# Patient Record
Sex: Female | Born: 1955 | Hispanic: No | Marital: Married | Smoking: Never smoker
Health system: Southern US, Community
[De-identification: ages and names within clinical notes are randomized; demographics above are authoritative.]

## PROBLEM LIST (undated history)

## (undated) ENCOUNTER — Emergency Department: Admission: EM | Payer: Self-pay | Source: Home / Self Care

## (undated) DIAGNOSIS — M5090 Cervical disc disorder, unspecified, unspecified cervical region: Secondary | ICD-10-CM

## (undated) DIAGNOSIS — Z832 Family history of diseases of the blood and blood-forming organs and certain disorders involving the immune mechanism: Secondary | ICD-10-CM

## (undated) DIAGNOSIS — R51 Headache: Secondary | ICD-10-CM

## (undated) DIAGNOSIS — G8929 Other chronic pain: Secondary | ICD-10-CM

## (undated) DIAGNOSIS — R519 Headache, unspecified: Secondary | ICD-10-CM

## (undated) DIAGNOSIS — I10 Essential (primary) hypertension: Secondary | ICD-10-CM

## (undated) DIAGNOSIS — K279 Peptic ulcer, site unspecified, unspecified as acute or chronic, without hemorrhage or perforation: Secondary | ICD-10-CM

## (undated) DIAGNOSIS — M75100 Unspecified rotator cuff tear or rupture of unspecified shoulder, not specified as traumatic: Secondary | ICD-10-CM

## (undated) HISTORY — DX: Cervical disc disorder, unspecified, unspecified cervical region: M50.90

## (undated) HISTORY — DX: Family history of diseases of the blood and blood-forming organs and certain disorders involving the immune mechanism: Z83.2

## (undated) HISTORY — DX: Headache, unspecified: R51.9

## (undated) HISTORY — PX: TONSILLECTOMY: SUR1361

## (undated) HISTORY — DX: Headache: R51

## (undated) HISTORY — PX: NECK SURGERY: SHX720

## (undated) HISTORY — DX: Peptic ulcer, site unspecified, unspecified as acute or chronic, without hemorrhage or perforation: K27.9

## (undated) HISTORY — DX: Unspecified rotator cuff tear or rupture of unspecified shoulder, not specified as traumatic: M75.100

## (undated) HISTORY — PX: OTHER SURGICAL HISTORY: SHX169

---
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
## 2008-11-06 ENCOUNTER — Ambulatory Visit: Payer: Self-pay | Admitting: Pulmonary Disease

## 2008-11-06 DIAGNOSIS — Z8711 Personal history of peptic ulcer disease: Secondary | ICD-10-CM

## 2008-11-06 DIAGNOSIS — J309 Allergic rhinitis, unspecified: Secondary | ICD-10-CM | POA: Insufficient documentation

## 2008-11-06 DIAGNOSIS — R51 Headache: Secondary | ICD-10-CM

## 2008-11-06 DIAGNOSIS — M719 Bursopathy, unspecified: Secondary | ICD-10-CM

## 2008-11-06 DIAGNOSIS — J45991 Cough variant asthma: Secondary | ICD-10-CM

## 2008-11-06 DIAGNOSIS — M5090 Cervical disc disorder, unspecified, unspecified cervical region: Secondary | ICD-10-CM | POA: Insufficient documentation

## 2008-11-06 DIAGNOSIS — R519 Headache, unspecified: Secondary | ICD-10-CM | POA: Insufficient documentation

## 2008-11-06 DIAGNOSIS — M67919 Unspecified disorder of synovium and tendon, unspecified shoulder: Secondary | ICD-10-CM | POA: Insufficient documentation

## 2008-11-09 LAB — CONVERTED CEMR LAB
AST: 23 units/L (ref 0–37)
Albumin: 3.9 g/dL (ref 3.5–5.2)
Alkaline Phosphatase: 65 units/L (ref 39–117)
BUN: 18 mg/dL (ref 6–23)
Chloride: 110 meq/L (ref 96–112)
Eosinophils Relative: 0.7 % (ref 0.0–5.0)
GFR calc non Af Amer: 94 mL/min
Glucose, Bld: 93 mg/dL (ref 70–99)
Lymphocytes Relative: 40.9 % (ref 12.0–46.0)
Monocytes Relative: 8.1 % (ref 3.0–12.0)
Neutrophils Relative %: 49.3 % (ref 43.0–77.0)
Platelets: 373 10*3/uL (ref 150–400)
Potassium: 3.9 meq/L (ref 3.5–5.1)
Total Protein: 7.1 g/dL (ref 6.0–8.3)
WBC: 7.1 10*3/uL (ref 4.5–10.5)

## 2008-11-24 ENCOUNTER — Telehealth (INDEPENDENT_AMBULATORY_CARE_PROVIDER_SITE_OTHER): Payer: Self-pay | Admitting: *Deleted

## 2008-12-01 ENCOUNTER — Ambulatory Visit: Payer: Self-pay | Admitting: Pulmonary Disease

## 2008-12-19 ENCOUNTER — Encounter: Payer: Self-pay | Admitting: Pulmonary Disease

## 2008-12-23 ENCOUNTER — Telehealth (INDEPENDENT_AMBULATORY_CARE_PROVIDER_SITE_OTHER): Payer: Self-pay | Admitting: *Deleted

## 2009-01-09 ENCOUNTER — Encounter: Payer: Self-pay | Admitting: Pulmonary Disease

## 2009-02-09 ENCOUNTER — Ambulatory Visit: Payer: Self-pay | Admitting: Pulmonary Disease

## 2009-02-09 DIAGNOSIS — E663 Overweight: Secondary | ICD-10-CM | POA: Insufficient documentation

## 2009-02-09 DIAGNOSIS — R5383 Other fatigue: Secondary | ICD-10-CM

## 2009-02-09 DIAGNOSIS — R5381 Other malaise: Secondary | ICD-10-CM

## 2009-03-20 ENCOUNTER — Telehealth: Payer: Self-pay | Admitting: Pulmonary Disease

## 2009-04-06 ENCOUNTER — Encounter: Payer: Self-pay | Admitting: Pulmonary Disease

## 2009-11-09 ENCOUNTER — Telehealth: Payer: Self-pay | Admitting: Pulmonary Disease

## 2009-11-24 ENCOUNTER — Ambulatory Visit: Payer: Self-pay | Admitting: Pulmonary Disease

## 2009-11-25 ENCOUNTER — Telehealth (INDEPENDENT_AMBULATORY_CARE_PROVIDER_SITE_OTHER): Payer: Self-pay | Admitting: *Deleted

## 2010-11-02 NOTE — Assessment & Plan Note (Signed)
Summary: rov/yearly/apc   CC:  9 month ROV & review of medical problems....  History of Present Illness: 55 y/o WF, mother of Angel Griffith, here for a follow up visit...   ~  Feb09:  she notes a dry hacking irritative cough x2months- assoc w/ some wheezing, chest congestion, SOB, small amt beige sputum on occas, no blood... she denies CP, palpit, dizzy, edema, etc... she went to [facility] and treated for bronchitis but no better...       ** Hx reveals asthma & AB in the past- main trigger appears to be URI's, Rx w/ Albuterol inhaler Prn...       ** she notes that her mother & grandmother had allergies that became apparent in thier 42's, and she gets sinus problems in the spring & fall of the year...       ** pt has also had a hx of PUD w/ a DU found on EGD in 2003, but she denies much reflux or nocturnal symptoms...      ** eval revealed clear CXR,  no signif obstruction on PFT,  labs all WNL x sed= 36 (?etiology)...      ** treated w/ Zyrtek, Flonase, Saline for sinuses.Marland KitchenMarland KitchenPred taper, Symbicort160, Mucinex, Proair Prn for asthma... Prilosec Bid for reflux...   ~  Mar10:  improved but persists w/ her c/o cough & congestion- she thinks it's sinus related and we discussed next step eval w/ allegy/ immunology check by DrKozlow- seen 3/10 w/ LER & reflux induced cough diagnosed, her allergy tests were neg but she doesn't believe it since she gets eye & nasal allergy symptoms when exposed outdoors... improved further on PPI + Ranitadine, along w/ continued Symbicort, Flonase, Antihistamines, etc...   ~  May10:  despite improvement from the pulm standpoint, Angel Griffith has developed a litany of other complaints including:  fatigue, not resting well, wakes tired, + some aches and pains that are suggestive of fibromyalgia... but she is worried about her thyroid- since her mother was hypo & her daughter has Grave's- & she notes hot flashes, irreg menses, can't lose weight ("it must be my thyroid because I eat  right & I'm very active- I'm 5'1" tall and I shouldn't weigh 160#")... she would like a thorough endocrine eval from DrDoerr in [facility]... she is clinically euthyroid, and screening TSH here 2/10 was normal at 1.01...   ~  November 24, 2009:  she saw DrDoerr (Endocine) in [facility] w/ labs & RAIU but we don't have these results "it was all WNL, but at the bottom end of norm" she says... she has stopped all her inhalers & antireflux Rx & was doing well until cough, some congestion, drainage returned over the last few months & antihist OTC not helping... just using Proair now, she still thinks "aalegy". refuses Pred Rx... I reviewed DrKozlow's notes- LER Rx w/ PPI + Ranitadine & airway dis Rx w/ Symbicort (insurance won't cover), Pred taper, Mucinex, Proair...    Current Problems:  ALLERGIC RHINITIS (ICD-477.9) - she notes spring and fall predominence for allergy/ sinus problems...  on OTC antihist & FLONASE Qhs w/ Saline Prn... she is troubled by "runny nose" and eye symptoms...  ~  allergy testing by DrKozlow 3/10 were neg...  ~  2/11: she still thinks "allergy" & we discussed 2nd opinion w/ DrSharma etal.  ASTHMA (ICD-493.90) - hx asthma & asthmatic bronchitis... ** see above ** - prev improved/ resolved w/ Rx for LER using Antireflux regimen, PPI & Ranitadine>> but  she stopped all Rx when better...  ~  2/11: symptoms returned>> Rx based on her insurance>> PROAIR Bid&Prn, QVAR 80Bid, MUCINEX Bid, Fluids, etc... she refuses Pred at this time... also Rx PROTONIX 40Qam + RANITADNE 300mg Qhs & antireflux regimen.  ? of THYROID PROBLEM - ** see above ** testing here & by [facility] was normal in 2010...  PEPTIC ULCER DISEASE, HX OF (ICD-V12.71) - she reports hx of PUD w/ duodenal ulcer seen on EGD in [place] in  ~2003... she denies lower GI problems- she has never had a colonoscopy & is overdue for this screening procedure...  ~  2/11:  rec to restart PROTONIX 40mg Qam & RANITADINE 300mg  Qhs, plus  antireflux regimen.  CERVICAL DISC DISORDER (ICD-722.91) - she notes hx of neck pain w/ Dx of herniated discs at C4-5 & C5-6... she uses Flexeril as needed w/ relief...  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 she is INTOL to Relafen & Naprosyn...  HEADACHE, CHRONIC (ICD-784.0) - she relates freq HA's to her sinuses and neck problems...  Family Hx of FACTOR V DEFICIENCY (ICD-286.3) - states brother diagnosed w/ Factor V Leiden deficiency when he developed a blood clot at age 20...   Allergies: 1)  ! Nabumetone (Nabumetone) 2)  ! Naproxen (Naproxen) 3)  ! Aspirin (Aspirin)  Comments:  Nurse/Medical Assistant: The patient's medications and allergies were reviewed with the patient and were updated in the Medication and Allergy Lists.  Past History:  Past Medical History:  ALLERGIC RHINITIS (ICD-477.9) ASTHMA (ICD-493.90) PEPTIC ULCER DISEASE, HX OF (ICD-V12.71) CERVICAL DISC DISORDER (ICD-722.91) Hx of ROTATOR CUFF SYNDROME (ICD-726.10) HEADACHE, CHRONIC (ICD-784.0) Family Hx of FACTOR V DEFICIENCY (ICD-286.3)  Past Surgical History: S/P tonsillectomy S/P CSection S/P BTL  Family History: Reviewed history from 11/06/2008 and no changes required. Father died age 87 w/ strokes, DM, amputation, +smoker, Etoh... Mother died age 58 w/ sudden death after bladder surgery, & hx of autoimmune "everything" RA/ Lupus/ Polymyositis, and Lymphoma... 1 Sibling- Brother w/ Factor V Leiden defic and blood clot at age 29...  Social History: Reviewed history from 11/06/2008 and no changes required. 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  The patient denies anorexia, fever, weight loss, weight gain, vision loss, decreased  hearing, hoarseness, chest pain, syncope, peripheral edema, prolonged cough, headaches, hemoptysis, abdominal pain, melena, hematochezia, severe indigestion/heartburn, hematuria, incontinence, muscle weakness, suspicious skin lesions, transient blindness, difficulty walking, depression, unusual weight change, abnormal bleeding, enlarged lymph nodes, and angioedema.    Vital Signs:  Patient profile:   55 year old female Height:      61 inches Weight:      151.38 pounds BMI:     28.71 O2 Sat:      99 % on Room air Temp:     97.2 degrees F oral Pulse rate:   93 / minute BP sitting:   142 / 82  (right arm) Cuff size:   regular  Vitals Entered By: Angel Griffith CMA (November 24, 2009 10:20 AM)  O2 Sat at Rest %:  99 O2 Flow:  Room air CC: 9 month ROV & review of medical problems... Is Patient Diabetic? No Pain Assessment Patient in pain? no  Comments meds updated today   Physical Exam  Additional Exam:  WD, WN, 55 y/o WF in NAD... GENERAL:  Alert & oriented; pleasant & cooperative... HEENT:  [place]/AT, EOM-wnl, PERRLA, EACs-clear, TMs-wnl, NOSE-clear, THROAT-clear & wnl. NECK:  Supple w/ fairROM; no JVD; normal carotid impulses w/o bruits; no thyromegaly or nodules palpated; no lymphadenopathy. CHEST:  improved w/ clear BS- no wheezes, no rales, no rhonchi... HEART:  Regular Rhythm; without murmurs/ rubs/ or gallops heard... ABDOMEN:  Soft & nontender; normal bowel sounds; no organomegaly or masses detected. EXT: without deformities or arthritic changes; no varicose veins/ venous insuffic/ or edema. She has a few trigger point, tender spots... NEURO:  CN's intact;  no focal neuro deficits... DERM:  No lesions noted; no rash etc...    MISC. Report  Procedure date:  11/24/2009  Findings:      DATA REVIEWED:  Records from DrKozlow in 2010, records from DrDoerr in 2010, [place] EMR notes/ XRays/ PFT/ labs work... 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 we discussed Rx w/ Airway inflamm meds but she declines Pred Rx at this time> therefore Rx QVAR 80 2spBid (based on her insurance formulary) w/ Proair/ Mucinex/ Fluids/ etc... she will also need LER Rx w/ antireflux regimen, Protonix Qam, Ranitadine Qhs... she wants allergy second opinion & we will refer to DrSharma.  The following medications were removed from the medication list:    Symbicort 160-4.5 Mcg/act Aero (Budesonide-formoterol fumarate) .Marland Kitchen... 2 inhalations two times a day regularly... Her updated medication list for this problem includes:    Proair Hfa 108 (90 Base) Mcg/act Aers (Albuterol sulfate) ..... Inhale 2 sp two times a day before the qvar, & extra as needed wheezing...    Qvar 80 Mcg/act Aers (Beclomethasone dipropionate) ..... Inhale 2 puffs two times a day regularly...  Orders: Allergy Referral  (Allergy)  Problem # 2:  PEPTIC ULCER DISEASE, HX OF (ICD-V12.71) Hx PUD, but the GERD/ reflux into larynx is a much more serious prob for her.... we discussed the pathophysiology of this reaction & the need for antireflux Rx, PROTONIX & RANITADINE...  The following medications were removed from the medication list:    Aciphex 20 Mg Tbec (Rabeprazole sodium) .Marland Kitchen... Take 1 tab by mouth once daily... Her updated medication list for this problem includes:    Protonix 40 Mg Tbec (Pantoprazole sodium) .Marland Kitchen... Take 1 tab by mouth 30 min before the first meal of the day...    Ranitidine Hcl 300 Mg Caps (Ranitidine hcl) .Marland Kitchen... Take 1 tab by mouth 30 min before bedtime...  Problem # 3:  OTHER MEDICAL PROBLEMS AS NOTED>>>  Complete Medication List: 1)  Proair Hfa 108 (90 Base) Mcg/act Aers (Albuterol sulfate) .... Inhale 2 sp two times a day before the qvar, & extra as needed wheezing... 2)  Qvar 80 Mcg/act Aers (Beclomethasone dipropionate) .... Inhale 2 puffs two times a day regularly... 3)  Protonix 40 Mg Tbec  (Pantoprazole sodium) .... Take 1 tab by mouth 30 min before the first meal of the day... 4)  Ranitidine Hcl 300 Mg Caps (Ranitidine hcl) .... Take 1 tab by mouth 30 min before bedtime...  Other Orders: Prescription Created Electronically [phone])  Patient Instructions: 1)  Today we updated your med list- see below.... 2)  For your ASTHMA: use the Proair, followed by the QVAR- 2 sprays of each twice daily.Marland KitchenMarland Kitchen 3)  You also  must use the Acid suppressor Rx w/ PROTONIX 30 min before the 1st meal of the day, and RANITADINE 30 min before bedtime.Marland KitchenMarland Kitchen  4)  Plus you need to re-establish the anti-reflux regimen= elevate the head of your bed 6", and nothing to eat or drink for the 4H prior to bedtime.Marland KitchenMarland Kitchen 5)  We will arrange for an allergy 2nd opinion at the [facility] allergy clinic.Marland KitchenMarland Kitchen 6)  Let me know if you are going to need a round of the Prednisone... Prescriptions: RANITIDINE HCL 300 MG CAPS (RANITIDINE HCL) take 1 tab by mouth 30 min before bedtime...  #30 x prn   Entered and Authorized by:   Michele Mcalpine MD   Signed by:   Michele Mcalpine MD on 11/24/2009   Method used:   Print then Give to Patient   RxID:   [id number] PROTONIX 40 MG TBEC (PANTOPRAZOLE SODIUM) take 1 tab by mouth 30 min before the first meal of the day...  #30 x prn   Entered and Authorized by:   Michele Mcalpine MD   Signed by:   Michele Mcalpine MD on 11/24/2009   Method used:   Print then Give to Patient   RxID:   [id number] PROAIR HFA 108 (90 BASE) MCG/ACT AERS (ALBUTEROL SULFATE) inhale 2 sp two times a day before the QVAR, & extra as needed wheezing...  #1 x prn   Entered and Authorized by:   Michele Mcalpine MD   Signed by:   Michele Mcalpine MD on 11/24/2009   Method used:   Print then Give to Patient   RxID:   [id number] QVAR 80 MCG/ACT AERS (BECLOMETHASONE DIPROPIONATE) inhale 2 puffs two times a day regularly...  #1 x prn   Entered and Authorized by:   Michele Mcalpine MD   Signed by:   Michele Mcalpine MD on 11/24/2009    Method used:   Print then Give to Patient   RxID:   [id number]

## 2010-11-02 NOTE — Progress Notes (Signed)
Summary: sample  Phone Note Call from Patient Call back at Home Phone [phone]   Caller: Patient Call For: Reya Aurich Reason for Call: Talk to Nurse Summary of Call: pt's insurance not paying for her Symbicort any longer.  Need a different inhaler called in for her or maybe she can get a sample until she comes in. Initial call taken by: Eugene Gavia,  November 09, 2009 11:43 AM  Follow-up for Phone Call        Please advise if ok to sample or change rx? Thanks. Zackery Barefoot CMA  November 09, 2009 12:30 PM    per SN---see if they will cover advair hfa  115/21  #1  2 sprays two times a day ----if they wont then ok to give her a sample and she can discuss at her ov with sn Randell Loop CMA  November 09, 2009 12:54 PM   LMTCB. Carron Curie CMA  November 09, 2009 2:39 PM [facility].  Aundra Millet Reynolds LPN  November 10, 2009 8:59 AM    Additional Follow-up for Phone Call Additional follow up Details #1::        LMTCB.Reynaldo Minium CMA  November 11, 2009 11:26 AM    Spoke with pt; sample at front for pick up; pt will try this first til her appt on 11/24/09 with SN.Reynaldo Minium CMA  November 11, 2009 3:22 PM

## 2010-11-02 NOTE — Progress Notes (Signed)
Summary: prescription refill  Phone Note Call from Patient Call back at Home Phone [phone]   Caller: Patient Call For: Kriste Basque Summary of Call: Patient caslled in to say she did not get her prescription for etodolac from Dr. Kriste Basque today as promised,  she would like it called in to [facility] in [place], [place] I let her know I would put the phone note in.  Denna Haggard, CMA  November 25, 2009 4:42 PM  Initial call taken by: dena  Follow-up for Phone Call        SN---ok to fill the etodolac?   please advise   thanks Randell Loop [facility]  November 25, 2009 5:17 PM   Additional Follow-up for Phone Call Additional follow up Details #1::        etodolac has been sent to the pharmacy.  lmom to make pt aware of rx sent to her pharmacy Randell Loop CMA  November 26, 2009 8:46 AM     New/Updated Medications: ETODOLAC 400 MG TABS (ETODOLAC) take one tablet by mouth two times a day with a meal as needed for arthritis pain Prescriptions: ETODOLAC 400 MG TABS (ETODOLAC) take one tablet by mouth two times a day with a meal as needed for arthritis pain  #60 x 6   Entered by:   Randell Loop CMA   Authorized by:   Michele Mcalpine MD   Signed by:   Randell Loop CMA on 11/26/2009   Method used:   Electronically to        [facility] [phone]* (retail)       [address]       [place], [place]  [address]       Ph: [phone]       Fax: [phone]   RxID:   [id number]

## 2011-12-19 ENCOUNTER — Encounter: Payer: Self-pay | Admitting: Pulmonary Disease

## 2011-12-19 ENCOUNTER — Ambulatory Visit (INDEPENDENT_AMBULATORY_CARE_PROVIDER_SITE_OTHER)
Admission: RE | Admit: 2011-12-19 | Discharge: 2011-12-19 | Disposition: A | Discharge status: Home / Self Care | Payer: 59 | Source: Ambulatory Visit | Attending: Pulmonary Disease | Admitting: Pulmonary Disease

## 2011-12-19 ENCOUNTER — Telehealth: Payer: Self-pay | Admitting: Pulmonary Disease

## 2011-12-19 ENCOUNTER — Ambulatory Visit (INDEPENDENT_AMBULATORY_CARE_PROVIDER_SITE_OTHER): Payer: 59 | Admitting: Pulmonary Disease

## 2011-12-19 DIAGNOSIS — J45909 Unspecified asthma, uncomplicated: Secondary | ICD-10-CM

## 2011-12-19 DIAGNOSIS — J988 Other specified respiratory disorders: Secondary | ICD-10-CM

## 2011-12-19 DIAGNOSIS — M5090 Cervical disc disorder, unspecified, unspecified cervical region: Secondary | ICD-10-CM

## 2011-12-19 DIAGNOSIS — Z8711 Personal history of peptic ulcer disease: Secondary | ICD-10-CM

## 2011-12-19 DIAGNOSIS — R51 Headache: Secondary | ICD-10-CM

## 2011-12-19 MED ORDER — ALBUTEROL SULFATE HFA 108 (90 BASE) MCG/ACT IN AERS: 2.0000 | INHALATION_SPRAY | Freq: Four times a day (QID) | RESPIRATORY_TRACT | Status: AC | PRN

## 2011-12-19 MED ORDER — HYDROCOD POLST-CHLORPHEN POLST 10-8 MG/5ML PO LQCR: 5.0000 mL | Freq: Every evening | ORAL | Status: DC | PRN

## 2011-12-19 MED ORDER — AZITHROMYCIN 250 MG PO TABS: ORAL_TABLET | ORAL | Status: AC

## 2011-12-19 NOTE — Telephone Encounter (Signed)
I spoke with pt and she c/o a lot of dry cough, bilateral ear pain, nasal congestion after a coughing fit, watery eyes, some sinus pressure, dizziness last night, and sweats off and on. She stated she ran a fever 4 days last week but none now. Pt has been taking nyquil and dyquil. 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 Pt has not been seen since 11/24/09. Please advise Dr. Kriste Basque, thanks  Allergies  Allergen Reactions  . Aspirin     REACTION: pt states INTOL to Aspirin w/ ulcer  . Nabumetone     REACTION: pt states INTOL to Relafen w/ red face \\T \ incr HR  . Naproxen     REACTION: pt states INTOL to Naprosyn w/ red face \\T \ incr HR     [facility]

## 2011-12-19 NOTE — Telephone Encounter (Signed)
LMOM for pt TCB 

## 2011-12-19 NOTE — Patient Instructions (Signed)
Today we updated your med list in our EPIC system...     For your bronchitis>    We wrote a prescription for Zithromax- take as directed on the ZPak...    Use the OTC DELSYM cough syrup- 2tsp twice daily...    Use the OTC MUCINEX- 600mg  tabs- 2 tabs twice daily w/ lots of fluids...  We also wrote for TUSSIONEX- use 1 tsp at bedtime for the nagging cough & to help you rest...  If you develop any reflux related symptoms (regurg, heartburn, throat symptoms, nighttime cough)>    Then start back on the OTC acid suppressors like PRILOSEC taken before dinner,     and ZANTAC or PEPCID at bedtime...  Call for any questions.Marland KitchenMarland Kitchen

## 2011-12-19 NOTE — Telephone Encounter (Signed)
Pt returned call. I put her in the 4pm slot per msg. Nothing further needed.

## 2011-12-19 NOTE — Telephone Encounter (Signed)
Per SN---it has been 2 years since she has been seen.  We cannot call anything in for her.  Can offer her ov today at 4pm.  thanks

## 2011-12-19 NOTE — Progress Notes (Signed)
Subjective:     Patient ID: Angel Griffith, female   DOB: [birth year], 56 y.o.   MRN: [id number]  HPI 54 y/o WF, mother of Angel Griffith, here for a follow up visit...  ~  Feb09:  she notes a dry hacking irritative cough x37months- assoc w/ some wheezing, chest congestion, SOB, small amt beige sputum on occas, no blood... she denies CP, palpit, dizzy, edema, etc... she went to [facility] and treated for bronchitis but no better...       >Hx reveals asthma & AB in the past- main trigger appears to be URI's, Rx w/ Albuterol inhaler Prn...       >she notes that her mother & grandmother had allergies that became apparent in thier 50's, and she gets sinus problems in the spring & fall of the year...       >pt has also had a hx of PUD w/ a DU found on EGD in 2003, but she denies much reflux or nocturnal symptoms...      >eval revealed clear CXR,  no signif obstruction on PFT,  labs all WNL x sed= 36 (?etiology)...      >treated w/ Zyrtek, Flonase, Saline for sinuses.Marland KitchenMarland KitchenPred taper, Symbicort160, Mucinex, Proair Prn for asthma... Prilosec Bid for reflux...  ~  Mar10:  improved but persists w/ her c/o cough & congestion- she thinks it's sinus related and we discussed next step eval w/ allegy/ immunology check by DrKozlow- seen 3/10 w/ LER & reflux induced cough diagnosed, her allergy tests were neg but she doesn't believe it since she gets eye & nasal allergy symptoms when exposed outdoors... improved further on PPI + Ranitadine, along w/ continued Symbicort, Flonase, Antihistamines, etc...  ~  May10:  despite improvement from the pulm standpoint, Angel Griffith has developed a litany of other complaints including:  fatigue, not resting well, wakes tired, + some aches and pains that are suggestive of fibromyalgia... but she is worried about her thyroid- since her mother was hypo & her daughter has Grave's- & she notes hot flashes, irreg menses, can't lose weight ("it must be my thyroid because I eat right & I'm very  active- I'm 5'1" tall and I shouldn't weigh 160#")... she would like a thorough endocrine eval from DrDoerr in [place] (sees her daugh)... she is clinically euthyroid, and screening TSH here 2/10 was normal at 1.01...  ~  November 24, 2009:  she saw DrDoerr (Endocine) in [facility] w/ labs & RAIU but we don't have these results "it was all WNL, but at the bottom end of norm" she says... she has stopped all her inhalers & antireflux Rx & was doing well until cough, some congestion, drainage returned over the last few months & antihist OTC not helping... just using Proair now, she still thinks "allegy". refuses Pred Rx... I reviewed DrKozlow's notes- LER Rx w/ PPI + Ranitadine & airway dis Rx w/ Symbicort (insurance won't cover), Pred taper, Mucinex, Proair...  ~  December 19, 2011:  72yr ROV & [name] states she's been doing well w/o regular meds (stopped her QVar, Protonix, Zantac) just using the albuterol inhaler as needed (states ~3/yr);  She notes onset of URI ~1wk ago "flu bug" w/ cough, mostly dry, chest congestion, denies SOB/ CP/ wheezing; head feels full, stopped up, sl dizzy;  on exam she has a croupy cough, scat rhonchi w/o wheezing, no sputum;  CXR showed prob RML atx from mucous plugging;  She does not want Prednisone Rx & we discussed the next best  approach w/ ZPak, Mucinex, Delsym, Fluids, & Tussionex Qhs if needed...  She is reminded to restart her antireflux regimen w/ elev HOB, Prilosec before dinner & Zantac qhs (esp if the symptoms of LER, noct cough, etc become refractory as before)...   PROBLEM LIST:    ALLERGIC RHINITIS (ICD-477.9) - she notes spring and fall predominence for allergy/ sinus problems...  on OTC antihist & FLONASE Qhs w/ Saline Prn... she is troubled by "runny nose" and eye symptoms... ~  allergy testing by DrKozlow 3/10 were neg... ~  2/11: she still thinks "allergy" & we discussed 2nd opinion w/ DrSharma etal.  ASTHMA (ICD-493.90) - hx asthma & asthmatic bronchitis... ** see  above ** - prev improved/ resolved w/ Rx for LER using Antireflux regimen, PPI & Ranitadine>> but she stopped all Rx when better... ~  2/11: symptoms returned>> Rx based on her insurance>> PROAIR Bid&Prn, QVAR 80Bid, MUCINEX Bid, Fluids, etc... she refuses Pred at this time... also Rx PROTONIX 40Qam + RANITADNE 300mg Qhs & antireflux regimen. ~  3/13:  Presented w/ AB exac after URI> CXR showed prob RML atx from mucous plugging; refused Pred Rx & given ZPak, Mucinex, Delsym, Tussionex...  ? of THYROID PROBLEM  <<see above>>  testing here & by [facility] was normal in 2010...  PEPTIC ULCER DISEASE, HX OF (ICD-V12.71) - she reports hx of PUD w/ duodenal ulcer seen on EGD in [place] in ~2003... she denies lower GI problems- she has never had a colonoscopy & is overdue for this screening procedure... ~  2/11:  rec to restart PROTONIX 40mg Qam & RANITADINE 300mg  Qhs, plus antireflux regimen.  CERVICAL DISC DISORDER (ICD-722.91) - she notes hx of neck pain w/ Dx of herniated discs at C4-5 & C5-6... she uses Flexeril as needed w/ relief...  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 she is INTOL to Relafen & Naprosyn...  HEADACHE, CHRONIC (ICD-784.0) - she relates freq HA's to her sinuses and neck problems...  Family Hx of FACTOR V DEFICIENCY (ICD-286.3) - states brother diagnosed w/ Factor V Leiden deficiency when he developed a blood clot at age 33...   Past Surgical History  Procedure Date  . Tonsillectomy   . Cesarean section   . Btl     Outpatient Encounter Prescriptions as of 12/19/2011  Medication Sig Dispense Refill  . albuterol (PROVENTIL HFA;VENTOLIN HFA) 108 (90 BASE) MCG/ACT inhaler Inhale 2 puffs into the lungs every 6 (six) hours as needed.      . beclomethasone (QVAR) 80 MCG/ACT inhaler Inhale 1 puff into the lungs as needed.      . pantoprazole (PROTONIX) 40 MG tablet Take 40 mg by mouth daily.      .  ranitidine (ZANTAC) 300 MG capsule Take 300 mg by mouth every evening.        Allergies  Allergen Reactions  . Aspirin     REACTION: pt states INTOL to Aspirin w/ ulcer  . Nabumetone     REACTION: pt states INTOL to Relafen w/ red face \\T \ incr HR  . Naproxen     REACTION: pt states INTOL to Naprosyn w/ red face \\T \ incr HR    Current Medications, Allergies, Past Medical History, Past Surgical History, Family History, and Social History were reviewed in [employer] record.    Review of Systems        See HPI - all other systems neg except  as noted...  The patient complains of dyspnea on exertion.  The patient denies anorexia, fever, weight loss, weight gain, vision loss, decreased hearing, hoarseness, chest pain, syncope, peripheral edema, prolonged cough, headaches, hemoptysis, abdominal pain, melena, hematochezia, severe indigestion/heartburn, hematuria, incontinence, muscle weakness, suspicious skin lesions, transient blindness, difficulty walking, depression, unusual weight change, abnormal bleeding, enlarged lymph nodes, and angioedema.     Objective:   Physical Exam     WD, WN, 56 y/o WF in NAD... GENERAL:  Alert & oriented; pleasant & cooperative... HEENT:  [place]/AT, EOM-wnl, PERRLA, EACs-clear, TMs-wnl, NOSE-clear, THROAT-clear & wnl. NECK:  Supple w/ fairROM; no JVD; normal carotid impulses w/o bruits; no thyromegaly or nodules palpated; no lymphadenopathy. CHEST:  Croupy cough, congested w/ scat rhonchi but no wheezing or consolidation HEART:  Regular Rhythm; without murmurs/ rubs/ or gallops heard... ABDOMEN:  Soft & nontender; normal bowel sounds; no organomegaly or masses detected. EXT: without deformities or arthritic changes; no varicose veins/ venous insuffic/ or edema. She has a few trigger point, tender spots... NEURO:  CN's intact;  no focal neuro deficits... DERM:  No lesions noted; no rash etc...  RADIOLOGY DATA:  Reviewed in the EPIC EMR  & discussed w/ the patient...    >>CXR 3/13 showed prob RML atx from mucous plugging...  LABORATORY DATA:  Reviewed in the EPIC EMR & discussed w/ the patient...   Assessment:     Acute Bronchitis & Bronchiolitis>  Hx ASTHMA & reactive airways disease>  She tells me that she has done well x yrs now w/o ICS & just using Albut prn; she refuses Pred rx- thinks prev dosepak gave her a cataract; we discussed Rx w/ ZPak, Mucinex, Delsym, Tussionex, etc...  Hx PUD, Hx LER/ GERD>  She stopped her prev Protonix/ Zantac therapy;  Advised on antireflux measures (Elev HOB, NPO after dinner, etc) & to restart OTC PRILOSEC before dinner & Zantac at bedtime...  Hx Cervical Disc problem>  She denies recent neck pain, decr ROM, arm radulopathy etc...  Hx HAs>  She denies recent HAs either MC, cervicogenic, or migraines...     Plan:     Patient's Medications  New Prescriptions   AZITHROMYCIN (ZITHROMAX) 250 MG TABLET    Take 2 tablets (500 mg) on  Day 1,  followed by 1 tablet (250 mg) once daily on Days 2 through 5.   CHLORPHENIRAMINE-HYDROCODONE (TUSSIONEX PENNKINETIC ER) 10-8 MG/5ML LQCR    Take 5 mLs by mouth at bedtime as needed.  Previous Medications   No medications on file  Modified Medications   Modified Medication Previous Medication   ALBUTEROL (PROVENTIL HFA;VENTOLIN HFA) 108 (90 BASE) MCG/ACT INHALER albuterol (PROVENTIL HFA;VENTOLIN HFA) 108 (90 BASE) MCG/ACT inhaler      Inhale 2 puffs into the lungs every 6 (six) hours as needed.    Inhale 2 puffs into the lungs every 6 (six) hours as needed.  Discontinued Medications   BECLOMETHASONE (QVAR) 80 MCG/ACT INHALER    Inhale 1 puff into the lungs as needed.   PANTOPRAZOLE (PROTONIX) 40 MG TABLET    Take 40 mg by mouth daily.   RANITIDINE (ZANTAC) 300 MG CAPSULE    Take 300 mg by mouth every evening.

## 2011-12-21 ENCOUNTER — Telehealth: Payer: Self-pay | Admitting: Pulmonary Disease

## 2011-12-21 MED ORDER — PREDNISONE (PAK) 5 MG PO TABS: ORAL_TABLET | ORAL | Status: DC

## 2011-12-21 NOTE — Telephone Encounter (Signed)
Called and spoke with pt and about her cxr results.  Pt voiced her understanding of the results.  Pt stated that she is now on the 4th pill of the zpak and she is not feeling any better.  She will cont the mucinex, zpak and delsym but pt wanted to know if SN felt she would benefit from taking the prednisone.  SN please advise.  Thanks  Allergies  Allergen Reactions  . Aspirin     REACTION: pt states INTOL to Aspirin w/ ulcer  . Nabumetone     REACTION: pt states INTOL to Relafen w/ red face \\T \ incr HR  . Naproxen     REACTION: pt states INTOL to Naprosyn w/ red face \\T \ incr HR

## 2011-12-21 NOTE — Telephone Encounter (Signed)
Per SN---yes recs for pred dosepak   5mg   6 day pack.  This has been sent to the pharmacy for the pt.  Called and lmom to make pt aware of pred pak sent to her pharmacy

## 2011-12-21 NOTE — Telephone Encounter (Signed)
Pt returned call for 3.18.13 cxr results:  Result Notes     Notes Recorded by Marcellus Scott, CMA on 12/21/2011 at 1:32 PM [email or web address] ------  Notes Recorded by Michele Mcalpine, MD on 12/20/2011 at 8:06 AM CXR w/ prob RML atx from mucous plugging... Needs incr fluids, MUCINEX 2Bid, cough & deep breathing exercises... Follow up here if not back to 100% on current therapy... SMN  ----- Advised pt of cxr results / recs as stated by SN.  Pt verbalized her understanding.  Pt does state that SN offered prednisone at ov but she had declined at that time.  She is now requesting requesting the prednisone be sent to [facility] on [address].  Dr Kriste Basque please advise of prednisone rx, thanks.

## 2014-02-17 ENCOUNTER — Telehealth: Payer: Self-pay | Admitting: Pulmonary Disease

## 2014-02-17 DIAGNOSIS — R519 Headache, unspecified: Secondary | ICD-10-CM

## 2014-02-17 DIAGNOSIS — R51 Headache: Principal | ICD-10-CM

## 2014-02-17 NOTE — Telephone Encounter (Signed)
Pt last seen 12/19/11  Spoke with pt who is c/o: headache for the past 3 days. States that it is in her eyes and face area / top of head.  Denies sinus pressure or PND. Also, reports she never has had high BP and took her BP today and it was 144/94 and doesn't know if headache could be due to elevated BP.  She is requesting rec's from SN. Pt has been advised SN no longer doing primary care as of 01/01/14 and will let us know if we can assist her in finding a new primary doctor.    Medication List       This list is accurate as of: 02/17/14 11:07 AM.  Always use your most recent med list.               albuterol 108 (90 BASE) MCG/ACT inhaler  Commonly known as:  PROVENTIL HFA;VENTOLIN HFA  Inhale 2 puffs into the lungs every 6 (six) hours as needed.     chlorpheniramine-HYDROcodone 10-8 MG/5ML Lqcr  Commonly known as:  TUSSIONEX PENNKINETIC ER  Take 5 mLs by mouth at bedtime as needed.     predniSONE 5 MG Tabs tablet  Commonly known as:  STERAPRED UNI-PAK  Take as directed.  Please give pt a 6 day pack

## 2014-02-17 NOTE — Telephone Encounter (Signed)
Per SN---  Ok to come and pick up rx for vicodin #50  1 po tid prn pain Sounds like muscle constriction headache.    [facility] for further eval of headaches.  SN can continue to see her for asthma issues but she will need to set up with new primary care doctor.  We can give her the numbers to some places or she can set up with someone in [place].   thanks

## 2014-02-17 NOTE — Telephone Encounter (Signed)
Called spoke with pt. She has rx for this at home. She will use that. Referral placed for neurology. Nothing further needed

## 2014-03-13 ENCOUNTER — Encounter: Payer: Self-pay | Admitting: *Deleted

## 2014-03-18 ENCOUNTER — Ambulatory Visit (INDEPENDENT_AMBULATORY_CARE_PROVIDER_SITE_OTHER): Payer: 59 | Admitting: Neurology

## 2014-03-18 ENCOUNTER — Encounter: Payer: Self-pay | Admitting: Neurology

## 2014-03-18 VITALS — BP 138/78 | HR 68 | Temp 97.7°F | Resp 16 | Ht 61.0 in | Wt 158.8 lb

## 2014-03-18 DIAGNOSIS — M5412 Radiculopathy, cervical region: Secondary | ICD-10-CM

## 2014-03-18 DIAGNOSIS — H532 Diplopia: Secondary | ICD-10-CM

## 2014-03-18 DIAGNOSIS — R51 Headache: Secondary | ICD-10-CM

## 2014-03-18 DIAGNOSIS — G4486 Cervicogenic headache: Secondary | ICD-10-CM

## 2014-03-18 DIAGNOSIS — M5010 Cervical disc disorder with radiculopathy, unspecified cervical region: Secondary | ICD-10-CM

## 2014-03-18 MED ORDER — GABAPENTIN 300 MG PO CAPS: 300.0000 mg | ORAL_CAPSULE | Freq: Every day | ORAL | Status: DC

## 2014-03-18 NOTE — Patient Instructions (Addendum)
1.  We will get MRI of the brain and cervical spine. [address]  03/28/14 at 10:00am,  You will need a driver do not take valium until your called back for MRI  2.  We will order NCV-EMG of the upper extremities 3.  For arm pain and headache, start gabapentin 300mg  at bedtime.  Side effects may include dizziness, sleepiness or nausea 4.  Follow up in 6 weeks.

## 2014-03-18 NOTE — Progress Notes (Signed)
NEUROLOGY CONSULTATION NOTE  Angel Griffith MRN: [id number] DOB: [birth year]  Referring Phiona Ramnauth: Dr. Kriste BasqueNadel Primary care Monte Bronder: Dr. Kriste BasqueNadel  Reason for consult:  Neck pain, headache, arm pain, episode of diplopia  HISTORY OF PRESENT ILLNESS: Angel HumphreyLaura Ades is a 58 year old primarily left-handed woman with history of chronic headache, cervical disc disease, allergic rhinitis, asthma and PUD who presents for continued neck pain and episode of diplopia.  About 15 years ago, she injured her neck and toward her right rotator cuff to a work-related injury. 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 This caused severe muscle spasms on the left side of her neck as well as pain into the left shoulder and arm. She was found to have a severely torn right rotator cuff but no surgery was performed due to the severity.  For the next several years, she suffered chronic left-sided neck and arm pain. She was in physical therapy for approximately one year with limited results. After physical therapy, she only achieved about 65% range of motion but still had to sleep upright in a rocking chair. She then underwent massage therapy and after 6 weeks, she developed 90% range of motion and was able to sleep in a bed. 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 She had been doing well but has noticed progressive worsening over the past 4-5 years. Her neck is stiff and she experiences a dull throbbing pain in her left arm, associated with tingling in her fingers and throbbing in her thumb. She occasionally gets by facial numbness. Sometimes when she turns her neck to the left, the symptoms resolved. Sometimes she notes the pain and the tingling while sleeping. She appreciates weakness, particularly in the left triceps such as when she is pushing down or straightening her arm, such as getting up on horses. More recently, she has started to develop a  dull aching in her right bicep. The neck pain sometimes causes a bifrontal headache, not associated with nausea or photophobia or phonophobia. Since she has been compensating by increasing use of her right arm, she has developed right sided neck pain as well.  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 She reports an episode that occurred approximately 2 months ago. She woke up with severe pain on the left side of her neck and left arm. She didn't recall any previous injury or straining. When she went downstairs to set the table, she developed vertical diplopia, lasting approximately 10 minutes. There was no associated headache at that time. There was no associated slurred speech.  She has tried muscle relaxants before. Skelaxin was ineffective. Flexeril sometimes helps. She does not usually like to take muscle relaxants 2 to the drowsiness, as she has to drive and travel a lot to go to her clients houses for massage therapy. She is unable to take NSAIDs to 2 peptic ulcer disease.  Incidentally, she was diagnosed with Lyme's disease a year and a half ago after she had a tick bite. At that time she had a characteristic bull's-eye rash and then developed fatigue, ankle swelling, and elbow pain. Lyme titers were negative based on her clinical symptoms, she was treated with doxycycline. On occasions, she still experiences ankle swelling, fatigue, and elbow pain.  PAST MEDICAL HISTORY: Past Medical History  Diagnosis Date  .  Allergic rhinitis   . Asthma   . Peptic ulcer disease   . Cervical disc disease   . Rotator cuff syndrome   . Chronic headache   . Family history of factor V deficiency     PAST SURGICAL HISTORY: Past Surgical History  Procedure Laterality Date  . Tonsillectomy    . Cesarean section    . Btl      MEDICATIONS: Current Outpatient Prescriptions on File Prior to Visit  Medication  Sig Dispense Refill  . albuterol (PROVENTIL HFA;VENTOLIN HFA) 108 (90 BASE) MCG/ACT inhaler Inhale 2 puffs into the lungs every 6 (six) hours as needed.  1 Inhaler  11  . chlorpheniramine-HYDROcodone (TUSSIONEX PENNKINETIC ER) 10-8 MG/5ML LQCR Take 5 mLs by mouth at bedtime as needed.  140 mL  0  . predniSONE (STERAPRED UNI-PAK) 5 MG TABS Take as directed.  Please give pt a 6 day pack  1 each  0   No current facility-administered medications on file prior to visit.    ALLERGIES: Allergies  Allergen Reactions  . Aspirin     REACTION: pt states INTOL to Aspirin w/ ulcer  . Levofloxacin   . Nabumetone     REACTION: pt states INTOL to Relafen w/ red face \\T \ incr HR  . Naproxen     REACTION: pt states INTOL to Naprosyn w/ red face \\T \ incr HR    FAMILY HISTORY: Family History  Problem Relation Age of Onset  . Stroke Father   . Cancer Mother   . Thyroid disease Mother   . Thyroid disease Mother   . Osteoarthritis Mother   . Lupus Mother     SOCIAL HISTORY: History   Social History  . Marital Status: Married    Spouse Name: Iantha Fallenkenneth Kozlov    Number of Children: 1  . Years of Education: N/A   Occupational History  . massage therapist and horse barn owner    Social History Main Topics  . Smoking status: Never Smoker   . Smokeless tobacco: Not on file  . Alcohol Use: Yes     Comment: social use  . Drug Use: No  . Sexual Activity: Yes    Partners: Male   Other Topics Concern  . Not on file   Social History Narrative  . No narrative on file    REVIEW OF SYSTEMS: Constitutional: No fevers, chills, or sweats, no generalized fatigue, change in appetite Eyes: No visual changes, double vision, eye pain Ear, nose and throat: No hearing loss, ear pain, nasal congestion, sore throat Cardiovascular: No chest pain, palpitations Respiratory:  No shortness of breath at rest or with exertion, wheezes GastrointestinaI: No nausea, vomiting, diarrhea, abdominal pain, fecal  incontinence Genitourinary:  No dysuria, urinary retention or frequency Musculoskeletal:  Neck pain.  Back pain Integumentary: No rash, pruritus, skin lesions Neurological: as above Psychiatric: No depression, insomnia, anxiety Endocrine: No palpitations, fatigue, diaphoresis, mood swings, change in appetite, change in weight, increased thirst Hematologic/Lymphatic:  No anemia, purpura, petechiae. Allergic/Immunologic: no itchy/runny eyes, nasal congestion, recent allergic reactions, rashes  PHYSICAL EXAM: Filed Vitals:   03/18/14 0905  BP: 138/78  Pulse: 68  Temp: 97.7 F (36.5 C)  Resp: 16   General: No acute distress Head:  Normocephalic/atraumatic Neck: supple, bilateral paraspinal tenderness (right greater than left) as well as suboccipital tenderness. Reduced range of motion when turning head to the right and flexing.  Back: bilateral paraspinal tenderness Heart: regular rate and rhythm Lungs: Clear to auscultation bilaterally. Vascular: No  carotid bruits. Neurological Exam: Mental status: alert and oriented to person, place, and time, recent and remote memory intact, fund of knowledge intact, attention and concentration intact, speech fluent and not dysarthric, language intact. Cranial nerves: CN I: not tested CN II: pupils equal, round and reactive to light, visual fields intact, fundi unremarkable, without vessel changes, exudates, hemorrhages or papilledema. CN III, IV, VI:  full range of motion, no nystagmus, no ptosis CN V: facial sensation intact CN VII: upper and lower face symmetric CN VIII: hearing intact CN IX, X: gag intact, uvula midline CN XI: sternocleidomastoid and trapezius muscles intact CN XII: tongue midline Bulk & Tone: normal, no fasciculations. Motor: 5 out of 5 throughout. Sensation: Endorses reduced pinprick sensation above the acromion bilaterally. Deep Tendon Reflexes: 1+ in both triceps and biceps, otherwise 2+ throughout, toes  downgoing Finger to nose testing: No dysmetria Heel to shin: No dysmetria Gait: Normal station and stride. Able to turn and walk in tandem. Romberg negative.  IMPRESSION: Cervicalgia Bilateral arm pain and tingling, consider radicular Cervicogenic headache Transient vertical diplopia.  One episode 2 months ago.  Etiology unknown.  Endorses no trauma to suggest vertebral dissection.  PLAN: 1.  MRI of the brain without contrast to evaluate any cause for the episode of vertical diplopia. 2. MRI of the cervical spine without contrast to evaluate for any significant cervical stenosis or nerve root impingement. 3. NCV-EMG of bilateral upper extremities to evaluate for a cervical radiculopathy versus brachial plexopathy versus mononeuropathy. 4. Will start gabapentin 300 mg at bedtime to address cervical genic headache and radicular pain. 5. Followup in 6 weeks.  Thank you for allowing me to take part in the care of this patient.  Shon Millet, DO  CC:  Alroy Dust, MD

## 2014-03-28 ENCOUNTER — Ambulatory Visit
Admission: RE | Admit: 2014-03-28 | Discharge: 2014-03-28 | Disposition: A | Discharge status: Home / Self Care | Payer: 59 | Source: Ambulatory Visit | Attending: Neurology | Admitting: Neurology

## 2014-03-28 DIAGNOSIS — G4486 Cervicogenic headache: Secondary | ICD-10-CM

## 2014-03-28 DIAGNOSIS — R51 Headache: Secondary | ICD-10-CM

## 2014-03-28 DIAGNOSIS — H532 Diplopia: Secondary | ICD-10-CM

## 2014-03-28 DIAGNOSIS — M5010 Cervical disc disorder with radiculopathy, unspecified cervical region: Secondary | ICD-10-CM

## 2014-03-31 ENCOUNTER — Telehealth: Payer: Self-pay | Admitting: *Deleted

## 2014-03-31 NOTE — Telephone Encounter (Signed)
I have left a message for  Dr Newell CoralNudelman nurse Lawanna KobusAngel  To call back for an appt .

## 2014-03-31 NOTE — Telephone Encounter (Signed)
Message copied by Fredirick MaudlinVAN DER GLAS, SUSAN E on Mon Mar 31, 2014  3:07 PM ------      Message from: JAFFE, ADAM R      Created: Mon Mar 31, 2014 12:53 PM       Susie.  I spoke with Ms Angel Griffith regarding MRI findings.  She would like to be referred to Dr. Shirlean Kellyobert Nudelman at [facility] for cervical radiculopathy.      ----- Message -----         From: Rad Results In Interface         Sent: 03/28/2014   1:30 PM           To: Cira ServantAdam Robert Jaffe, DO                   ------

## 2014-04-02 ENCOUNTER — Telehealth: Payer: Self-pay | Admitting: Neurology

## 2014-04-02 NOTE — Telephone Encounter (Signed)
I spoke with Ms. Angel Griffith regarding MRI results of the cervical spine.  It does show right facet arthrosis at C2-3, which may be contributing to headaches.  It shows left C7 nerve root impingement, which may be contributing to left triceps pain.  It also shows bilateral C6 nerve root impingement, which may be contributing to bilateral thumb tingling.  Options for conservative management would be to optimize medications to address nerve pain, or referral for selective nerve root injections at these sites to see if it relieves the discomfort.  She would actually prefer a consultation with a neurosurgeon to see if she may be a surgical candidate.  We will oblige her request.

## 2014-04-07 ENCOUNTER — Telehealth: Payer: Self-pay | Admitting: *Deleted

## 2014-04-07 ENCOUNTER — Telehealth: Payer: Self-pay | Admitting: Neurology

## 2014-04-07 NOTE — Telephone Encounter (Signed)
Left message x 3 with patient about Dr Newell CoralNudelman they will contact her with appt day and time after he looks over records .

## 2014-04-07 NOTE — Telephone Encounter (Signed)
I left message with patient  Regarding appt

## 2014-04-07 NOTE — Telephone Encounter (Signed)
I have made 3 calls to Dr. Shirlean Kellyobert Nudelman office for  appt  I still have not gotten this appt .

## 2014-04-07 NOTE — Telephone Encounter (Signed)
Message copied by Fredirick MaudlinVAN DER GLAS, Mickey Hebel E on Mon Apr 07, 2014  2:04 PM ------      Message from: JAFFE, ADAM R      Created: Mon Mar 31, 2014 12:53 PM       Susie.  I spoke with Ms Valentina LucksGriffin regarding MRI findings.  She would like to be referred to Dr. Shirlean Kellyobert Nudelman at [facility] for cervical radiculopathy.      ----- Message -----         From: Rad Results In Interface         Sent: 03/28/2014   1:30 PM           To: Cira ServantAdam Robert Jaffe, DO                   ------

## 2014-04-07 NOTE — Telephone Encounter (Signed)
Pt needs to talk to someone about a referral and test results please call [phone]

## 2014-04-09 NOTE — Telephone Encounter (Signed)
Pt is returning your call she want sher mri results put on my chart and needs a referral please call [phone]

## 2014-04-18 ENCOUNTER — Telehealth: Payer: Self-pay | Admitting: Neurology

## 2014-04-18 NOTE — Telephone Encounter (Signed)
Pt called stating that she called Dr. Bettina GaviaNoodleman and the office staff informed her that they have not received her referral or [place]ard from Dr. Everlena CooperJaffe regarding her referral. She wants to know what is going on. Pt said she wants a copy of the MRI on a disk and a report as well.  Please call pt back [phone]

## 2014-05-02 ENCOUNTER — Telehealth: Payer: Self-pay | Admitting: Pulmonary Disease

## 2014-05-02 NOTE — Telephone Encounter (Signed)
Called and spoke with pt and she stated that she had thought that SN had done blood work to check the factor for her family hx of blood clot disorders.  i have reviewed the chart and did not see where this lab has been done.  i advised the pt of this and she stated that she has been seeing Dr. Zachery DauerBarnes for PCP and she will call and see if she can set this up.  Nothing further is needed.

## 2017-04-18 ENCOUNTER — Institutional Professional Consult (permissible substitution): Payer: Self-pay | Admitting: Internal Medicine

## 2017-04-19 ENCOUNTER — Ambulatory Visit (INDEPENDENT_AMBULATORY_CARE_PROVIDER_SITE_OTHER): Payer: 59 | Admitting: Internal Medicine

## 2017-04-19 ENCOUNTER — Encounter: Payer: Self-pay | Admitting: Internal Medicine

## 2017-04-19 VITALS — BP 130/74 | HR 110 | Ht 60.0 in | Wt 155.8 lb

## 2017-04-19 DIAGNOSIS — I10 Essential (primary) hypertension: Secondary | ICD-10-CM

## 2017-04-19 DIAGNOSIS — J45991 Cough variant asthma: Secondary | ICD-10-CM

## 2017-04-19 LAB — NITRIC OXIDE: Nitric Oxide: 10

## 2017-04-19 NOTE — Progress Notes (Signed)
Subjective:     Patient ID: Angel Griffith, female   DOB: 1956/04/07,   MRN: [id number]  HPI  [age] yowf never smoker   Onset of cough aournd 2008 typically oct /nov and march/ April eval by Kriste BasqueNadel and Kozlow neg allergy last seen pulm clinic 2012  And improved to point in May 2013 just using albuterol prn and no acid suppression or steroids  But during the seasons despite the neg allergy tests continued to need for a lot of albuterol plus otc nasal rx and did fine until May 2018 severe cough evolving over 48 h p "caught the crud" > dx pna/ rx pred/ zpak and another abx and neb coughed to point of vomiting so rx steroid inhaler mist ? 90% improved referred to pulmonary clinic 04/19/2017 by Dr   Zachery DauerBarnes with pt's main concern ? Why do I keep getting pna"  (note absence of cxr's showing any convincing pna)    Note h/o cx neck surgery Nov 2016 with "sense of something stuck there" since  Tried off acei and on ARB briefly but bp too high on losartan     04/19/2017 1st [facility] office visit/ Wert  On ACEi/ no maint resp rx  Chief Complaint  Patient presents with  . Pulmonary Consult    Referred by Jarrett Sohoourtney Wharton, PA. Pt states she had PNA in May 2018 and she has had persisant cough since then.  She has been using her albuterol inhaler 2 x per wk on average.   if not coughing breathing is fine Still having random coughing fits but no gagging / vomiting at this point   Not limited by breathing from desired activities    No obvious day to day or daytime variability or assoc excess/ purulent sputum or mucus plugs or hemoptysis or cp or chest tightness, subjective wheeze or overt sinus or hb symptoms. No unusual exp hx or h/o childhood pna/ asthma or knowledge of premature birth.  Sleeping ok without nocturnal  or early am exacerbation  of respiratory  c/o's or need for noct saba. Also denies any obvious fluctuation of symptoms with weather or environmental changes or other aggravating or  alleviating factors except as outlined above   Current Medications, Allergies, Complete Past Medical History, Past Surgical History, Family History, and Social History were reviewed in [facility] electronic medical record.  ROS  The following are not active complaints unless bolded sore throat, dysphagia, dental problems, itching, sneezing,  nasal congestion or excess/ purulent secretions, ear ache,   fever, chills, sweats, unintended wt loss, classically pleuritic or exertional cp,  orthopnea pnd or leg swelling, presyncope, palpitations, abdominal pain, anorexia, nausea, vomiting, diarrhea  or change in bowel or bladder habits, change in stools or urine, dysuria,hematuria,  rash, arthralgias, visual complaints, headache, numbness, weakness or ataxia or problems with walking or coordination,  change in mood/affect or memory.               Review of Systems     Objective:   Physical Exam    amb wf nad  Wt Readings from Last 3 Encounters:  04/19/17 155 lb 12.8 oz (70.7 kg)  03/18/14 158 lb 12.8 oz (72 kg)  12/19/11 165 lb 3.2 oz (74.9 kg)    Vital signs reviewed  - Note on arrival 02 sats  98% on RA    HEENT: nl dentition, turbinates bilaterally, and oropharynx. Nl external ear canals without cough reflex   NECK :  without JVD/Nodes/TM/ nl carotid  upstrokes bilaterally   LUNGS: no acc muscle use,  Nl contour chest which is clear to A and P bilaterally without cough on insp or exp maneuvers   CV:  RRR  no s3 or murmur or increase in P2, and no edema   ABD:  soft and nontender with nl inspiratory excursion in the supine position. No bruits or organomegaly appreciated, bowel sounds nl  MS:  Nl gait/ ext warm without deformities, calf tenderness, cyanosis or clubbing No obvious joint restrictions   SKIN: warm and dry without lesions    NEURO:  alert, approp, nl sensorium with  no motor or cerebellar deficits apparent.    cxr reports reviewed from 11/06/08 to 03/01/17 =   all the reports  we have show ? Fat pad LLL vs scar as unchanged since 2010    Assessment:

## 2017-04-19 NOTE — Patient Instructions (Addendum)
In event cough flares > Try prilosec otc 20mg   Take 30-60 min before first meal of the day and Pepcid ac (famotidine) 20 mg one @  bedtime until cough is completely gone for at least a week without the need for cough suppression   GERD (REFLUX)  is an extremely common cause of respiratory symptoms just like yours , many times with no obvious heartburn at all.    It can be treated with medication, but also with lifestyle changes including elevation of the head of your bed (ideally with 6 inch  bed blocks),  Smoking cessation, avoidance of late meals, excessive alcohol, and avoid fatty foods, chocolate, peppermint, colas, red wine, and acidic juices such as orange juice.  NO MINT OR MENTHOL PRODUCTS SO NO COUGH DROPS   USE SUGARLESS CANDY INSTEAD (Jolley ranchers or Stover's or Life Savers) or even ice chips will also do - the key is to swallow to prevent all throat clearing. NO OIL BASED VITAMINS - use powdered substitutes.  For drainage / throat tickle try take CHLORPHENIRAMINE  4 mg - take one every 4 hours as needed - available over the counter- may cause drowsiness so start with just a bedtime dose or two and see how you tolerate it before trying in daytime    For cough > delsym cough syrup   You will continue to have violent coughing on ACE inhibitors when you start coughing for any reason and I strongly recommend you permanently stop them with best option ibesartan 150 mg daily   Prevnar 13 will also reduce your risk of recurrent pneumonia   Please see patient coordinator before you leave today  to schedule sinus CT   Pulmonary follow up is as needed

## 2017-04-20 DIAGNOSIS — I10 Essential (primary) hypertension: Secondary | ICD-10-CM | POA: Insufficient documentation

## 2017-04-20 NOTE — Assessment & Plan Note (Signed)
ACE inhibitors are problematic in  pts with airway complaints because  even experienced pulmonologists can't always distinguish ace effects from copd/asthma.  By themselves they don't actually cause a problem, much like oxygen can't by itself start a fire, but they certainly serve as a powerful catalyst or enhancer for any "fire"  or inflammatory process in the upper airway, be it caused by an ET  tube or more commonly reflux (especially in the obese or pts with known GERD or who are on biphoshonates).    In the era of ARB near equivalency , it just makes sense to avoid ACEI  Entirely for a period of several years to establish baseline in terms of resp symptoms then regroup here if pattern not markedly improved over what she's experienced for the last several years due to "allergies" that Kozlow could not detect

## 2017-04-20 NOTE — Assessment & Plan Note (Signed)
FENO 04/19/2017  =   10  Spirometry 04/19/2017  wnl off all rx with min curvature    The most common causes of chronic cough in immunocompetent adults include the following: upper airway cough syndrome (UACS), previously referred to as postnasal drip syndrome (PNDS), which is caused by variety of rhinosinus conditions; (2) asthma; (3) GERD; (4) chronic bronchitis from cigarette smoking or other inhaled environmental irritants; (5) nonasthmatic eosinophilic bronchitis; and (6) bronchiectasis.   These conditions, singly or in combination, have accounted for up to 94% of the causes of chronic cough in prospective studies.   Other conditions have constituted no >6% of the causes in prospective studies These have included bronchogenic carcinoma, chronic interstitial pneumonia, sarcoidosis, left ventricular failure, ACEI-induced cough, and aspiration from a condition associated with pharyngeal dysfunction.    Chronic cough is often simultaneously caused by more than one condition. A single cause has been found from 38 to 82% of the time, multiple causes from 18 to 62%. Multiply caused cough has been the result of three diseases up to 42% of the time.       This is most likely not asthma at all but strongly favor Upper airway cough syndrome (previously labeled PNDS) , is  so named because it's frequently impossible to sort out how much is  CR/sinusitis with freq throat clearing (which can be related to primary GERD)   vs  causing  secondary (" extra esophageal")  GERD from wide swings in gastric pressure that occur with throat clearing, often  promoting self use of mint and menthol lozenges that reduce the lower esophageal sphincter tone and exacerbate the problem further in a cyclical fashion.   These are the same pts (now being labeled as having "irritable larynx syndrome" by some cough centers) who not infrequently have a history of having failed to tolerate ace inhibitors,  dry powder inhalers or  biphosphonates or report having atypical/extraesophageal reflux (coughing to vomit qualifies) symptoms that don't respond to standard doses of PPI  and are easily confused as having aecopd or asthma flares by even experienced allergists/ pulmonologists (myself included).   In addition: Of the three most common causes of  Sub-acute or recurrent or chronic cough, only one (GERD)  can actually contribute to/ trigger  the other two (asthma and post nasal drip syndrome)  and perpetuate the cylce of cough.  While not intuitively obvious, many patients with chronic low grade reflux do not cough until there is a primary insult that disturbs the protective epithelial barrier and exposes sensitive nerve endings.   This is typically viral as was likely the case with her recent "pna"  but can be direct physical injury such as with an endotracheal tube or (cervical neck surgery for sure).   The point is that once this occurs, it is difficult to eliminate the cycle  using anything but a maximally effective acid suppression regimen at least in the short run, accompanied by an appropriate diet to address non acid GERD and control / eliminate the cough itself for at least 3 days.    Based on these concerns I strongly rec  1) permanent d/c acei > consider ibesartan up to 300 mg in place of acei 2) max gerd rx when coughing  3) complete w/u with sinus ct and in meantime rx pnds with 1st gen h1 per guidelines for uacs   If not better return to clinic 6 weeks off acei.   Total time devoted to counseling  > 50 %  of initial 60 min office visit:  review case with pt/ discussion of options/alternatives/ personally creating written customized instructions  in presence of pt  then going over those specific  Instructions directly with the pt including how to use all of the meds but in particular covering each new medication in detail and the difference between the maintenance= "automatic" meds and the prns using an action plan  format for the latter (If this problem/symptom => do that organization reading Left to right).  Please see AVS from this visit for a full list of these instructions which I personally wrote for this pt and  are unique to this visit.

## 2017-05-03 ENCOUNTER — Inpatient Hospital Stay: Admission: RE | Admit: 2017-05-03 | Payer: 59 | Source: Ambulatory Visit

## 2017-05-29 ENCOUNTER — Emergency Department (HOSPITAL_COMMUNITY): Payer: 59

## 2017-05-29 ENCOUNTER — Emergency Department (HOSPITAL_COMMUNITY)
Admission: EM | Admit: 2017-05-29 | Discharge: 2017-05-29 | Disposition: A | Discharge status: Home / Self Care | Payer: 59 | Attending: Emergency Medicine | Admitting: Emergency Medicine

## 2017-05-29 ENCOUNTER — Encounter (HOSPITAL_COMMUNITY): Payer: Self-pay | Admitting: Emergency Medicine

## 2017-05-29 DIAGNOSIS — M25431 Effusion, right wrist: Secondary | ICD-10-CM | POA: Insufficient documentation

## 2017-05-29 DIAGNOSIS — W010XXA Fall on same level from slipping, tripping and stumbling without subsequent striking against object, initial encounter: Secondary | ICD-10-CM | POA: Insufficient documentation

## 2017-05-29 DIAGNOSIS — M25571 Pain in right ankle and joints of right foot: Secondary | ICD-10-CM | POA: Diagnosis not present

## 2017-05-29 DIAGNOSIS — S6991XA Unspecified injury of right wrist, hand and finger(s), initial encounter: Secondary | ICD-10-CM | POA: Diagnosis present

## 2017-05-29 DIAGNOSIS — I10 Essential (primary) hypertension: Secondary | ICD-10-CM | POA: Diagnosis not present

## 2017-05-29 DIAGNOSIS — Z79899 Other long term (current) drug therapy: Secondary | ICD-10-CM | POA: Diagnosis not present

## 2017-05-29 DIAGNOSIS — S63501A Unspecified sprain of right wrist, initial encounter: Secondary | ICD-10-CM | POA: Diagnosis not present

## 2017-05-29 DIAGNOSIS — J45909 Unspecified asthma, uncomplicated: Secondary | ICD-10-CM | POA: Diagnosis not present

## 2017-05-29 DIAGNOSIS — Y999 Unspecified external cause status: Secondary | ICD-10-CM | POA: Diagnosis not present

## 2017-05-29 DIAGNOSIS — Y929 Unspecified place or not applicable: Secondary | ICD-10-CM | POA: Diagnosis not present

## 2017-05-29 DIAGNOSIS — Y939 Activity, unspecified: Secondary | ICD-10-CM | POA: Diagnosis not present

## 2017-05-29 DIAGNOSIS — M25471 Effusion, right ankle: Secondary | ICD-10-CM | POA: Insufficient documentation

## 2017-05-29 MED ORDER — TRAMADOL HCL 50 MG PO TABS: 50.0000 mg | ORAL_TABLET | Freq: Four times a day (QID) | ORAL | 0 refills | Status: AC | PRN

## 2017-05-29 NOTE — ED Triage Notes (Signed)
Patient states fell over a stool and twisted her right ankle and hurt her right wrist.

## 2017-05-29 NOTE — ED Provider Notes (Signed)
[facility] DEPT Provider Note   CSN: [id number] Arrival date & time: 05/29/17  1405     History   Chief Complaint Chief Complaint  Patient presents with  . Ankle Pain    HPI Angel Griffith is a 61 y.o. female.  HPI   Angel Griffith is a 61 y.o. female who presents to the Emergency Department complaining of right ankle and right wrist pain.  States that she tripped over a small stool shortly before ER arrival.  Pain is associated with movement and she describes the pain as throbbing and sharp.  She also complains of swelling to her wrist and the lateral ankle.  She denies head injury, LOC, neck or back pain, numbness or weakness of the extremities.  No therapies attempted prior to arrival  Past Medical History:  Diagnosis Date  . Allergic rhinitis   . Asthma   . Cervical disc disease   . Chronic headache   . Family history of factor V deficiency   . Peptic ulcer disease   . Rotator cuff syndrome     Patient Active Problem List   Diagnosis Date Noted  . Essential hypertension 04/20/2017  . OVERWEIGHT 02/09/2009  . FATIGUE 02/09/2009  . ALLERGIC RHINITIS 11/06/2008  . Cough variant asthma  vs uacs from acei/ cyclical cough  11/06/2008  . CERVICAL DISC DISORDER 11/06/2008  . ROTATOR CUFF SYNDROME 11/06/2008  . HEADACHE, CHRONIC 11/06/2008  . PEPTIC ULCER DISEASE, HX OF 11/06/2008    Past Surgical History:  Procedure Laterality Date  . btl    . CESAREAN SECTION    . TONSILLECTOMY      OB History    No data available       Home Medications    Prior to Admission medications   Medication Sig Start Date End Date Taking? Authorizing Provider  albuterol (PROVENTIL HFA;VENTOLIN HFA) 108 (90 BASE) MCG/ACT inhaler Inhale 2 puffs into the lungs every 6 (six) hours as needed. 12/19/11   Michele Mcalpine, MD  cyclobenzaprine (FLEXERIL) 10 MG tablet Take 10 mg by mouth 3 (three) times daily as needed for muscle spasms.    [provider]  lisinopril  (PRINIVIL,ZESTRIL) 20 MG tablet Take 30 mg by mouth daily. 04/17/17   [provider]  traMADol (ULTRAM) 50 MG tablet Take 1 tablet (50 mg total) by mouth every 6 (six) hours as needed. 05/29/17   Pauline Aus, PA-C    Family History Family History  Problem Relation Age of Onset  . Stroke Father   . Cancer Mother   . Thyroid disease Mother   . Osteoarthritis Mother   . Lupus Mother     Social History Social History  Substance Use Topics  . Smoking status: Never Smoker  . Smokeless tobacco: Never Used  . Alcohol use Yes     Comment: social use     Allergies   Aspirin; Doxycycline; Levofloxacin; Nabumetone; Naproxen; and Avelox [moxifloxacin hcl in nacl]   Review of Systems Review of Systems  Constitutional: Negative for chills and fever.  Respiratory: Negative for shortness of breath.   Cardiovascular: Negative for chest pain.  Gastrointestinal: Negative for abdominal pain, nausea and vomiting.  Genitourinary: Negative for difficulty urinating, dysuria and flank pain.  Musculoskeletal: Positive for arthralgias (right wrist and ankle pain) and joint swelling. Negative for back pain and neck pain.  Skin: Negative for color change and wound.  Neurological: Negative for dizziness, syncope, weakness, numbness and headaches.  Psychiatric/Behavioral: Negative for confusion.  All other  systems reviewed and are negative.    Physical Exam Updated Vital Signs BP 122/84 (BP Location: Left Arm)   Pulse 83   Temp 97.9 F (36.6 C) (Oral)   Resp 16   Ht 5' (1.524 m)   Wt 68.9 kg (152 lb)   SpO2 100%   BMI 29.69 kg/m   Physical Exam  Constitutional: She is oriented to person, place, and time. She appears well-developed and well-nourished. No distress.  HENT:  Head: Normocephalic and atraumatic.  Neck: Normal range of motion.  Cardiovascular: Normal rate, regular rhythm, normal heart sounds and intact distal pulses.   Pulmonary/Chest: Effort normal and breath  sounds normal. She exhibits no tenderness.  Musculoskeletal: She exhibits edema and tenderness. She exhibits no deformity.  ttp of the distal right wrist.  Mild edema present.  No bony deformity.  ttp of the lateral right ankle with moderate edema.  No proximal tenderness. Compartments are soft  Neurological: She is alert and oriented to person, place, and time. No sensory deficit. She exhibits normal muscle tone. Coordination normal.  Skin: Skin is warm and dry. Capillary refill takes less than 2 seconds. No erythema.  Psychiatric: She has a normal mood and affect.  Nursing note and vitals reviewed.    ED Treatments / Results  Labs (all labs ordered are listed, but only abnormal results are displayed) Labs Reviewed - No data to display  EKG  EKG Interpretation None       Radiology Dg Wrist Complete Right  Result Date: 05/29/2017 CLINICAL DATA:  61 year old female status post fall. EXAM: RIGHT WRIST - COMPLETE 3+ VIEW COMPARISON:  None. FINDINGS: There is no evidence of fracture or dislocation. There is no evidence of arthropathy or other focal bone abnormality. Soft tissues are unremarkable. IMPRESSION: Negative. Electronically Signed   By: Sande Brothers M.D.   On: 05/29/2017 15:24   Dg Ankle Complete Right  Result Date: 05/29/2017 CLINICAL DATA:  61 year old female status post fall. EXAM: RIGHT ANKLE - COMPLETE 3+ VIEW COMPARISON:  None. FINDINGS: There is no definite evidence of fracture, dislocation, or joint effusion. An ossific density at the medial malleolus is favored to represent an accessory ossicle. There is no significant overlying soft tissue swelling. There is no evidence of arthropathy or other focal bone abnormality. Soft tissues are unremarkable. IMPRESSION: 1. No definite evidence for acute fracture, dislocation or effusion. 2. Ossific density at the medial malleolus without significant overlying soft tissue swelling is favored to represent an accessory ossicle.  Correlate with any point tenderness at this region. Electronically Signed   By: Sande Brothers M.D.   On: 05/29/2017 15:28    Procedures Procedures (including critical care time)  Medications Ordered in ED Medications - No data to display   Initial Impression / Assessment and Plan / ED Course  I have reviewed the triage vital signs and the nursing notes.  Pertinent labs & imaging results that were available during my care of the patient were reviewed by me and considered in my medical decision making (see chart for details).     Pt prefers to f/u with orthopedics in [place].  Referral info given for [facility].    XR's neg for fx.  NV intact.  Wrist splint applied.  Cam walker applied and pt requested crutches.   Final Clinical Impressions(s) / ED Diagnoses   Final diagnoses:  Acute right ankle pain  Sprain of right wrist, initial encounter    New Prescriptions New Prescriptions  TRAMADOL (ULTRAM) 50 MG TABLET    Take 1 tablet (50 mg total) by mouth every 6 (six) hours as needed.     Pauline Aus, PA-C 06/01/17 1718    Vanetta Mulders, MD 06/07/17 [phone]

## 2017-05-29 NOTE — Discharge Instructions (Signed)
Elevate your right ankle and apply ice packs on and off, you may also apply ice to the wrist. Call one of the orthopedic groups listed to arrange a follow-up appointment in a few days if the symptoms are not improving.

## 2017-07-30 ENCOUNTER — Encounter (HOSPITAL_COMMUNITY): Payer: Self-pay | Admitting: Emergency Medicine

## 2017-07-30 ENCOUNTER — Emergency Department (HOSPITAL_COMMUNITY)
Admission: EM | Admit: 2017-07-30 | Discharge: 2017-07-31 | Disposition: A | Discharge status: Home / Self Care | Payer: 59 | Attending: Emergency Medicine | Admitting: Emergency Medicine

## 2017-07-30 ENCOUNTER — Emergency Department (HOSPITAL_COMMUNITY): Payer: 59

## 2017-07-30 DIAGNOSIS — R51 Headache: Secondary | ICD-10-CM | POA: Insufficient documentation

## 2017-07-30 DIAGNOSIS — Z79899 Other long term (current) drug therapy: Secondary | ICD-10-CM | POA: Insufficient documentation

## 2017-07-30 DIAGNOSIS — R000 Tachycardia, unspecified: Secondary | ICD-10-CM | POA: Diagnosis not present

## 2017-07-30 DIAGNOSIS — J45909 Unspecified asthma, uncomplicated: Secondary | ICD-10-CM | POA: Insufficient documentation

## 2017-07-30 DIAGNOSIS — G8929 Other chronic pain: Secondary | ICD-10-CM

## 2017-07-30 DIAGNOSIS — R110 Nausea: Secondary | ICD-10-CM | POA: Diagnosis not present

## 2017-07-30 DIAGNOSIS — I10 Essential (primary) hypertension: Secondary | ICD-10-CM | POA: Insufficient documentation

## 2017-07-30 DIAGNOSIS — M545 Low back pain, unspecified: Secondary | ICD-10-CM

## 2017-07-30 HISTORY — DX: Essential (primary) hypertension: I10

## 2017-07-30 LAB — URINALYSIS, ROUTINE W REFLEX MICROSCOPIC
Bacteria, UA: NONE SEEN
Bilirubin Urine: NEGATIVE
Glucose, UA: NEGATIVE mg/dL
Ketones, ur: 5 mg/dL — AB
Leukocytes, UA: NEGATIVE
Nitrite: NEGATIVE
Protein, ur: NEGATIVE mg/dL
Specific Gravity, Urine: 1.017 (ref 1.005–1.030)
Squamous Epithelial / LPF: NONE SEEN
pH: 5 (ref 5.0–8.0)

## 2017-07-30 MED ORDER — PROMETHAZINE HCL 12.5 MG PO TABS
12.5000 mg | ORAL_TABLET | Freq: Once | ORAL | Status: DC
  Filled 2017-07-30: qty 1

## 2017-07-30 MED ORDER — HYDROCODONE-ACETAMINOPHEN 5-325 MG PO TABS
2.0000 | ORAL_TABLET | Freq: Once | ORAL | Status: DC
  Filled 2017-07-30: qty 2

## 2017-07-30 MED ORDER — HYDROCODONE-ACETAMINOPHEN 5-325 MG PO TABS
1.0000 | ORAL_TABLET | Freq: Once | ORAL | Status: AC
  Administered 2017-07-30: 1 via ORAL
  Filled 2017-07-30: qty 1

## 2017-07-30 MED ORDER — ONDANSETRON HCL 4 MG PO TABS
4.0000 mg | ORAL_TABLET | Freq: Once | ORAL | Status: AC
  Administered 2017-07-30: 4 mg via ORAL
  Filled 2017-07-30: qty 1

## 2017-07-30 NOTE — ED Notes (Signed)
Pt states her head & back are still hurting.

## 2017-07-30 NOTE — ED Triage Notes (Signed)
Pt recently had change in Blood pressure medication, due to cough, tonight BP 160/100 at home and nausea

## 2017-07-31 LAB — BASIC METABOLIC PANEL
Anion gap: 14 (ref 5–15)
BUN: 16 mg/dL (ref 6–20)
CO2: 26 mmol/L (ref 22–32)
CREATININE: 0.87 mg/dL (ref 0.44–1.00)
Calcium: 10.6 mg/dL — ABNORMAL HIGH (ref 8.9–10.3)
Chloride: 98 mmol/L — ABNORMAL LOW (ref 101–111)
GFR calc Af Amer: >60 mL/min (ref >60)
GFR calc non Af Amer: >60 mL/min (ref >60)
Glucose, Bld: 126 mg/dL — ABNORMAL HIGH (ref 65–99)
Potassium: 4.2 mmol/L (ref 3.5–5.1)
SODIUM: 138 mmol/L (ref 135–145)

## 2017-07-31 MED ORDER — HYDROCODONE-ACETAMINOPHEN 5-325 MG PO TABS: 1.0000 | ORAL_TABLET | ORAL | 0 refills | Status: AC | PRN

## 2017-07-31 MED ORDER — ONDANSETRON HCL 4 MG PO TABS: 4.0000 mg | ORAL_TABLET | Freq: Four times a day (QID) | ORAL | 0 refills | Status: AC

## 2017-07-31 NOTE — Discharge Instructions (Signed)
Urine tests and kidney function are negative for acute problem.  Your potassium is well within normal limits.  The x-ray of your back shows arthritis changes and an increase in the curvature of your spine.  Please use a heating pad to your back.  Please use Tylenol for mild pain, use Norco for more severe pain.  Norco may cause drowsiness, please use this medication with caution.  Use Zofran for nausea every 6 hours as needed.  Please discuss your back pain and your blood pressure with Dr. Zachery DauerBarnes as soon as possible.

## 2017-07-31 NOTE — ED Notes (Signed)
Pt alert & oriented x4, stable gait. Patient given discharge instructions, paperwork & prescription(s). Patient informed not to drive, operate any equipment & handel any important documents 4 hours after taking pain medication. Patient  instructed to stop at the registration desk to finish any additional paperwork. Patient  verbalized understanding. Pt left department w/ no further questions. 

## 2017-07-31 NOTE — ED Provider Notes (Signed)
[facility] EMERGENCY DEPARTMENT Provider Note   CSN: [id number] Arrival date & time: 07/30/17  4098     History   Chief Complaint Chief Complaint  Patient presents with  . Hypertension    HPI Angel Griffith is a 61 y.o. female.  Patient is a 61 year old female who presents to the emergency department with a complaint of hypertension.  The patient states that she has been having headache, back pain, feeling funny.  It is of note that she recently changed to hydrochlorothiazide as a antihypertensive.  She read some of the side effects, and she states that she frequently has problems with side effects from medications.  She called her primary physician on call person and they advised her to come to the emergency department for evaluation and recheck.  At home her blood pressure was elevated at 160/100.  The patient states she was also affected by being nauseated.  It is of note that the patient started hydrochlorothiazide on Friday  October 26.  She states that it was shortly after she took the medicine that she began to have increase symptoms.  She presents to the emergency department for assistance with this problem.  The patient denies any double vision.  She has not had any loss of control of right or left upper extremities.  She has not had frequent falls.  No difficulty with speaking.  And no significant changes in her balance.      Past Medical History:  Diagnosis Date  . Allergic rhinitis   . Asthma   . Cervical disc disease   . Chronic headache   . Family history of factor V deficiency   . Hypertension   . Peptic ulcer disease   . Rotator cuff syndrome     Patient Active Problem List   Diagnosis Date Noted  . Essential hypertension 04/20/2017  . OVERWEIGHT 02/09/2009  . FATIGUE 02/09/2009  . ALLERGIC RHINITIS 11/06/2008  . Cough variant asthma  vs uacs from acei/ cyclical cough  11/06/2008  . CERVICAL DISC DISORDER 11/06/2008  . ROTATOR CUFF SYNDROME 11/06/2008    . HEADACHE, CHRONIC 11/06/2008  . PEPTIC ULCER DISEASE, HX OF 11/06/2008    Past Surgical History:  Procedure Laterality Date  . btl    . CESAREAN SECTION    . NECK SURGERY    . TONSILLECTOMY      OB History    No data available       Home Medications    Prior to Admission medications   Medication Sig Start Date End Date Taking? Authorizing Provider  albuterol (PROVENTIL HFA;VENTOLIN HFA) 108 (90 BASE) MCG/ACT inhaler Inhale 2 puffs into the lungs every 6 (six) hours as needed. 12/19/11  Yes Michele Mcalpine, MD  cyclobenzaprine (FLEXERIL) 10 MG tablet Take 10 mg by mouth 3 (three) times daily as needed for muscle spasms.   Yes [provider]  hydrochlorothiazide (HYDRODIURIL) 25 MG tablet Take 12.5 mg by mouth daily. 07/28/17  Yes [provider]  traMADol (ULTRAM) 50 MG tablet Take 1 tablet (50 mg total) by mouth every 6 (six) hours as needed. Patient not taking: Reported on 07/30/2017 05/29/17   Pauline Aus, PA-C    Family History Family History  Problem Relation Age of Onset  . Stroke Father   . Cancer Mother   . Thyroid disease Mother   . Osteoarthritis Mother   . Lupus Mother     Social History Social History  Substance Use Topics  . Smoking status: Never  Smoker  . Smokeless tobacco: Never Used  . Alcohol use Yes     Comment: social use     Allergies   Aspirin; Doxycycline; Levofloxacin; Nabumetone; Naproxen; and Avelox [moxifloxacin hcl in nacl]   Review of Systems Review of Systems  Constitutional: Negative for activity change.       All ROS Neg except as noted in HPI  HENT: Negative for nosebleeds.   Eyes: Negative for photophobia and discharge.  Respiratory: Negative for cough, shortness of breath and wheezing.   Cardiovascular: Negative for chest pain and palpitations.  Gastrointestinal: Positive for nausea. Negative for abdominal pain and blood in stool.  Genitourinary: Negative for dysuria, frequency and hematuria.   Musculoskeletal: Positive for back pain. Negative for arthralgias and neck pain.  Skin: Negative.   Neurological: Positive for headaches. Negative for dizziness, seizures and speech difficulty.  Psychiatric/Behavioral: Negative for confusion and hallucinations.     Physical Exam Updated Vital Signs BP 113/80 (BP Location: Right Arm)   Pulse 99   Temp 99.4 F (37.4 C) (Oral)   Resp 16   Ht 4\' 11"  (1.499 m)   Wt 68 kg (150 lb)   SpO2 93%   BMI 30.30 kg/m   Physical Exam  Constitutional: She appears well-developed and well-nourished. No distress.  HENT:  Head: Normocephalic and atraumatic.  Right Ear: External ear normal.  Left Ear: External ear normal.  Nasal congestion  Eyes: Conjunctivae are normal. Right eye exhibits no discharge. Left eye exhibits no discharge. No scleral icterus.  Neck: Neck supple. No tracheal deviation present.  Cardiovascular: Regular rhythm and intact distal pulses.  Tachycardia present.   Pulmonary/Chest: Effort normal and breath sounds normal. No stridor. No respiratory distress. She has no wheezes. She has no rales.  Abdominal: Soft. Bowel sounds are normal. She exhibits no distension. There is no tenderness. There is no rebound and no guarding.  Musculoskeletal: She exhibits no edema or tenderness.       Lumbar back: She exhibits pain and spasm.  Neurological: She is alert. She has normal strength. No cranial nerve deficit (no facial droop, extraocular movements intact, no slurred speech) or sensory deficit. She exhibits normal muscle tone. She displays no seizure activity. Coordination normal.  Skin: Skin is warm and dry. No rash noted.  Psychiatric: She has a normal mood and affect.  Nursing note and vitals reviewed.    ED Treatments / Results  Labs (all labs ordered are listed, but only abnormal results are displayed) Labs Reviewed  URINALYSIS, ROUTINE W REFLEX MICROSCOPIC - Abnormal; Notable for the following:       Result Value   Hgb  urine dipstick SMALL (*)    Ketones, ur 5 (*)    All other components within normal limits  BASIC METABOLIC PANEL - Abnormal; Notable for the following:    Chloride 98 (*)    Glucose, Bld 126 (*)    Calcium 10.6 (*)    All other components within normal limits    EKG  EKG Interpretation None       Radiology Dg Lumbar Spine Complete  Result Date: 07/31/2017 CLINICAL DATA:  Lumbosacral back pain. EXAM: LUMBAR SPINE - COMPLETE 4+ VIEW COMPARISON:  Radiograph 06/17/2014 FINDINGS: Broad-based dextroscoliotic curvature lumbar spine which is increased from 2015. Unchanged minimal anterolisthesis of L4 on L5. Facet arthropathy at L4-L5 and L5-S1. Mild multilevel endplate spurring with preservation of disc spaces. Vertebral body heights are preserved. No fracture or evidence of focal lesion. Atherosclerosis of the abdominal  aorta is incidentally noted. IMPRESSION: 1. No evidence of acute abnormality. 2. Set arthropathy at L4-L5 and L5-S1. Unchanged grade 1 anterolisthesis of L4 on L5, likely secondary to facet disease. 3. Increased rightward curvature of the lumbar spine compared to 2015 exam may be positional or progressive scoliosis. Electronically Signed   By: Rubye OaksMelanie  Ehinger M.D.   On: 07/31/2017 00:41    Procedures Procedures (including critical care time)  Medications Ordered in ED Medications  HYDROcodone-acetaminophen (NORCO/VICODIN) 5-325 MG per tablet 1 tablet (1 tablet Oral Given 07/30/17 2350)  ondansetron (ZOFRAN) tablet 4 mg (4 mg Oral Given 07/30/17 2350)     Initial Impression / Assessment and Plan / ED Course  I have reviewed the triage vital signs and the nursing notes.  Pertinent labs & imaging results that were available during my care of the patient were reviewed by me and considered in my medical decision making (see chart for details).       Final Clinical Impressions(s) / ED Diagnoses MDM Vital signs reviewed.  Patient has a tachycardia  present.  Discussed with the patient that we will look for other issues that could be causing her tachycardia and headache and blood pressure elevations along with the possible side effects from hydrochlorothiazide.  Patient treated with Norco and Zofran for her nausea and headache and back pain.  Basic metabolic panel is nonacute.  The patient was concerned that her back pain was related to kidney problems that were also related to the hydrochlorothiazide.  I reassured the patient that her BUN was 16 and her creatinine was 0.87.  The anion gap is 14.  Urinalysis shows a yellow clear specimen with a specific gravity of 1.017.  Leukocyte esterase and nitrates are negative.  There is 0-5 red cells and white blood cells per high-powered field.  X-ray of the lumbar spine shows no acute abnormality.  Most situations have not changed much since 2015.  The patient has anterior listhesis at the L4 on L5.  There is multilevel endplate spurring notice.  The vertebral heights are well-preserved.  There is some increase in rightward curvature of the spine and there is question of progressive scoliosis versus spasm.  I discussed all these findings with the patient in terms of which she understands.  The patient will discuss her blood pressure medicine with Dr. Zachery DauerBarnes, as well as her headache and back symptoms.  The patient will be treated with 2 days of Zofran and Norco in order to give her a chance to see her primary physician.  Patient is in agreement with this plan.   Final diagnoses:  Essential hypertension  Chronic left-sided low back pain without sciatica    New Prescriptions New Prescriptions   No medications on file     Ivery QualeBryant, Lincon Sahlin, Cordelia Poche-C 07/31/17 0121    Raeford RazorKohut, Stephen, MD 08/01/17 [phone]

## 2019-07-22 IMAGING — DX DG ANKLE COMPLETE 3+V*R*
3 series · 3 of 3 positions shown · non-contrast
Comparison: None.

CLINICAL DATA: 60-year-old female status post fall.

EXAM:
RIGHT ANKLE - COMPLETE 3+ VIEW

[ankle ap]
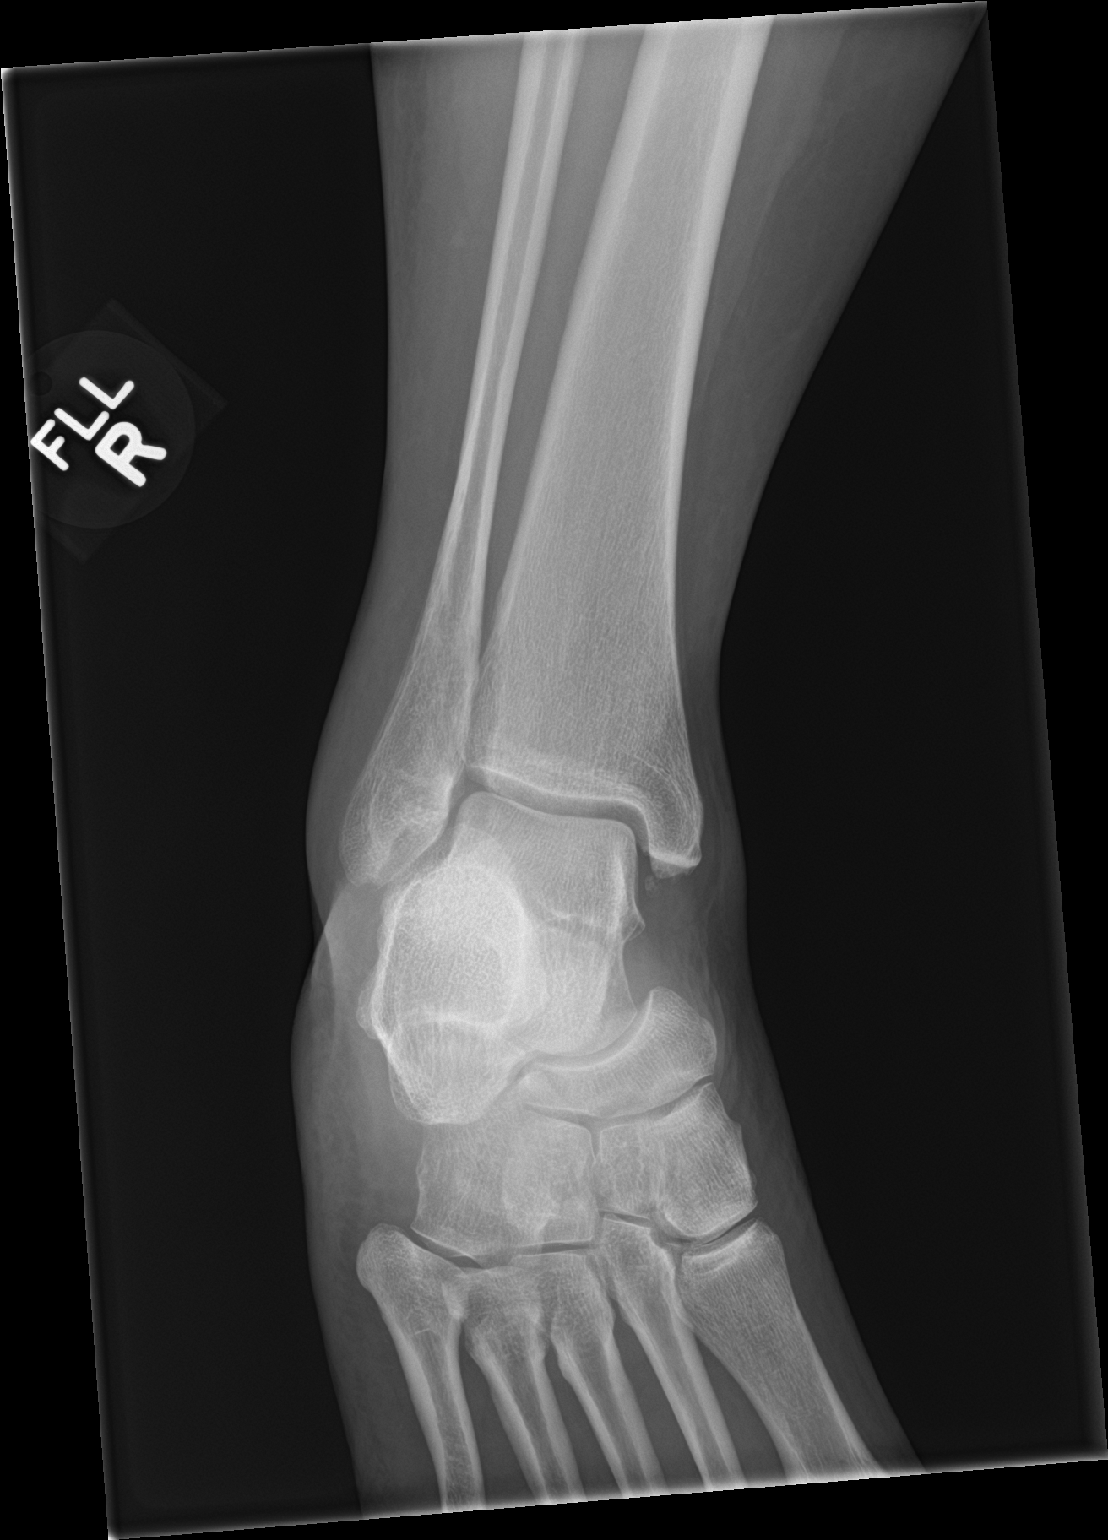

[ankle obl]
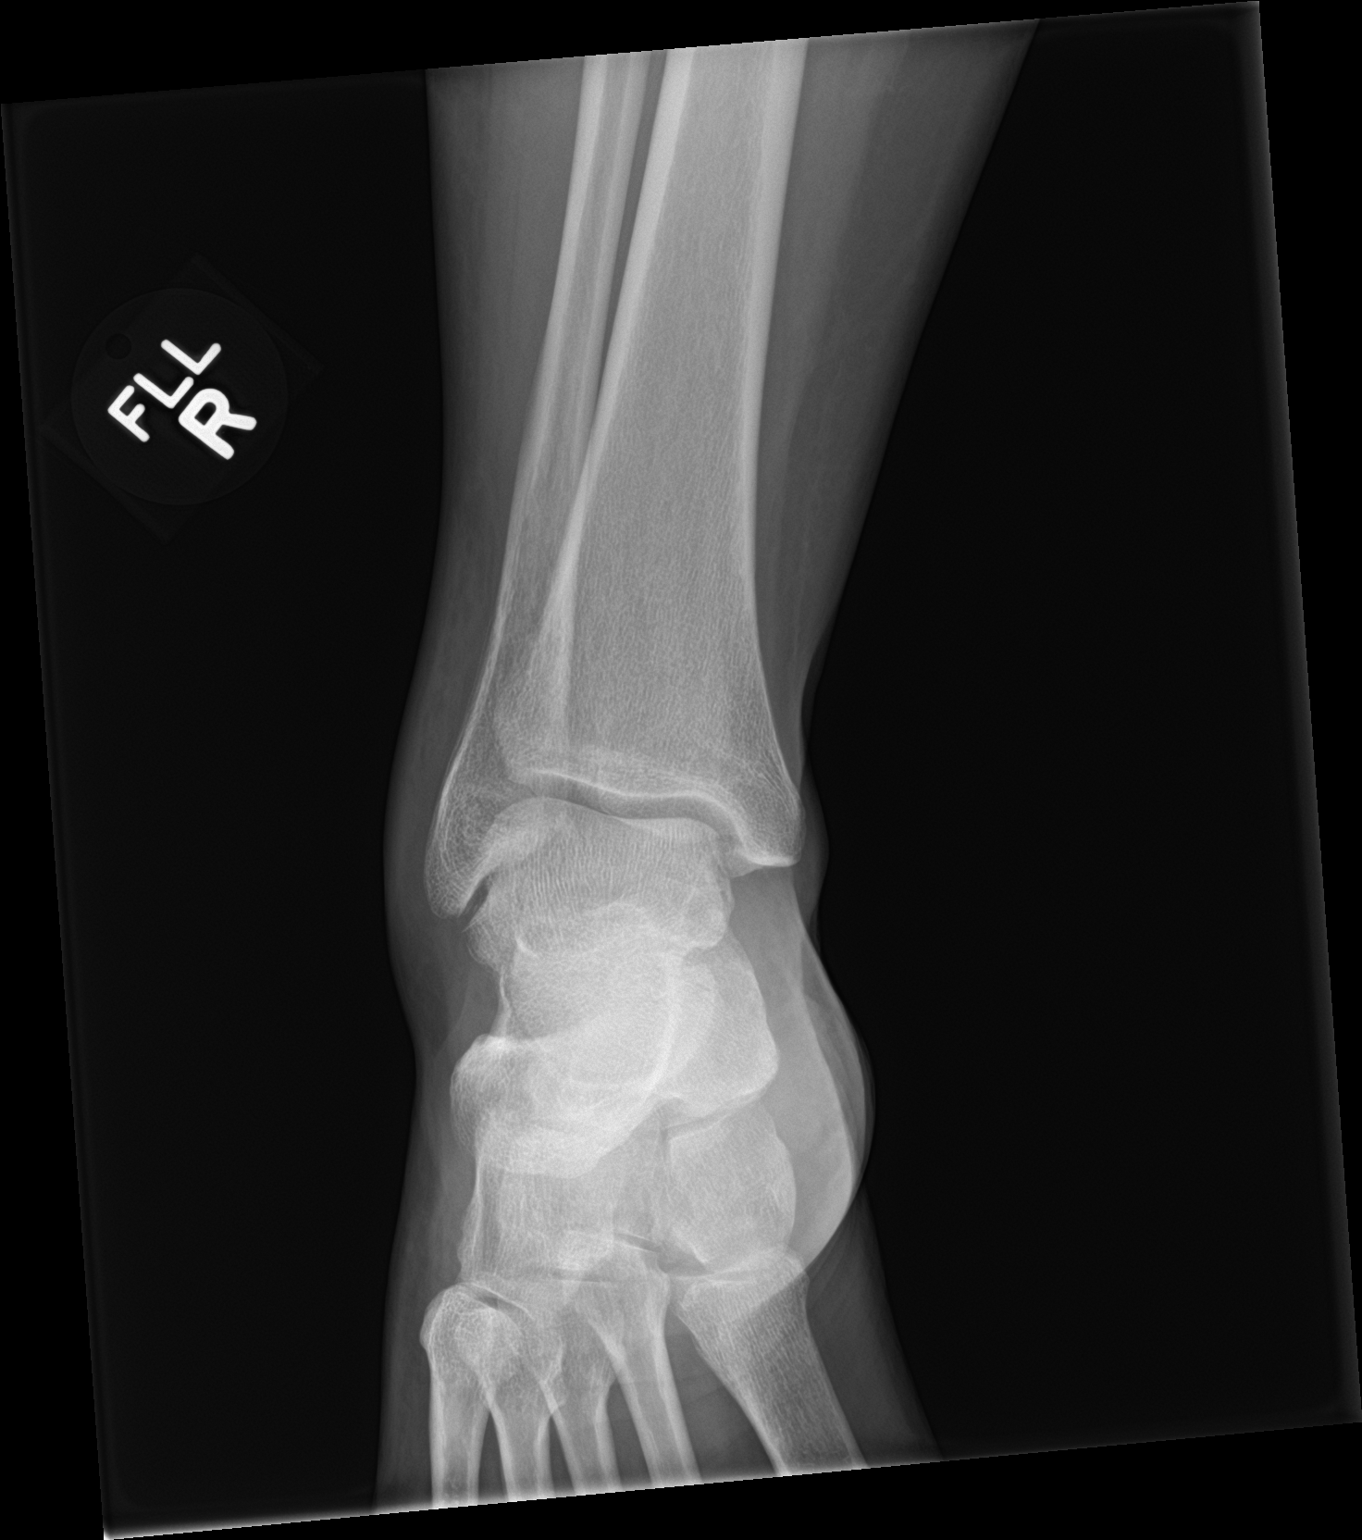

[ankle lat]
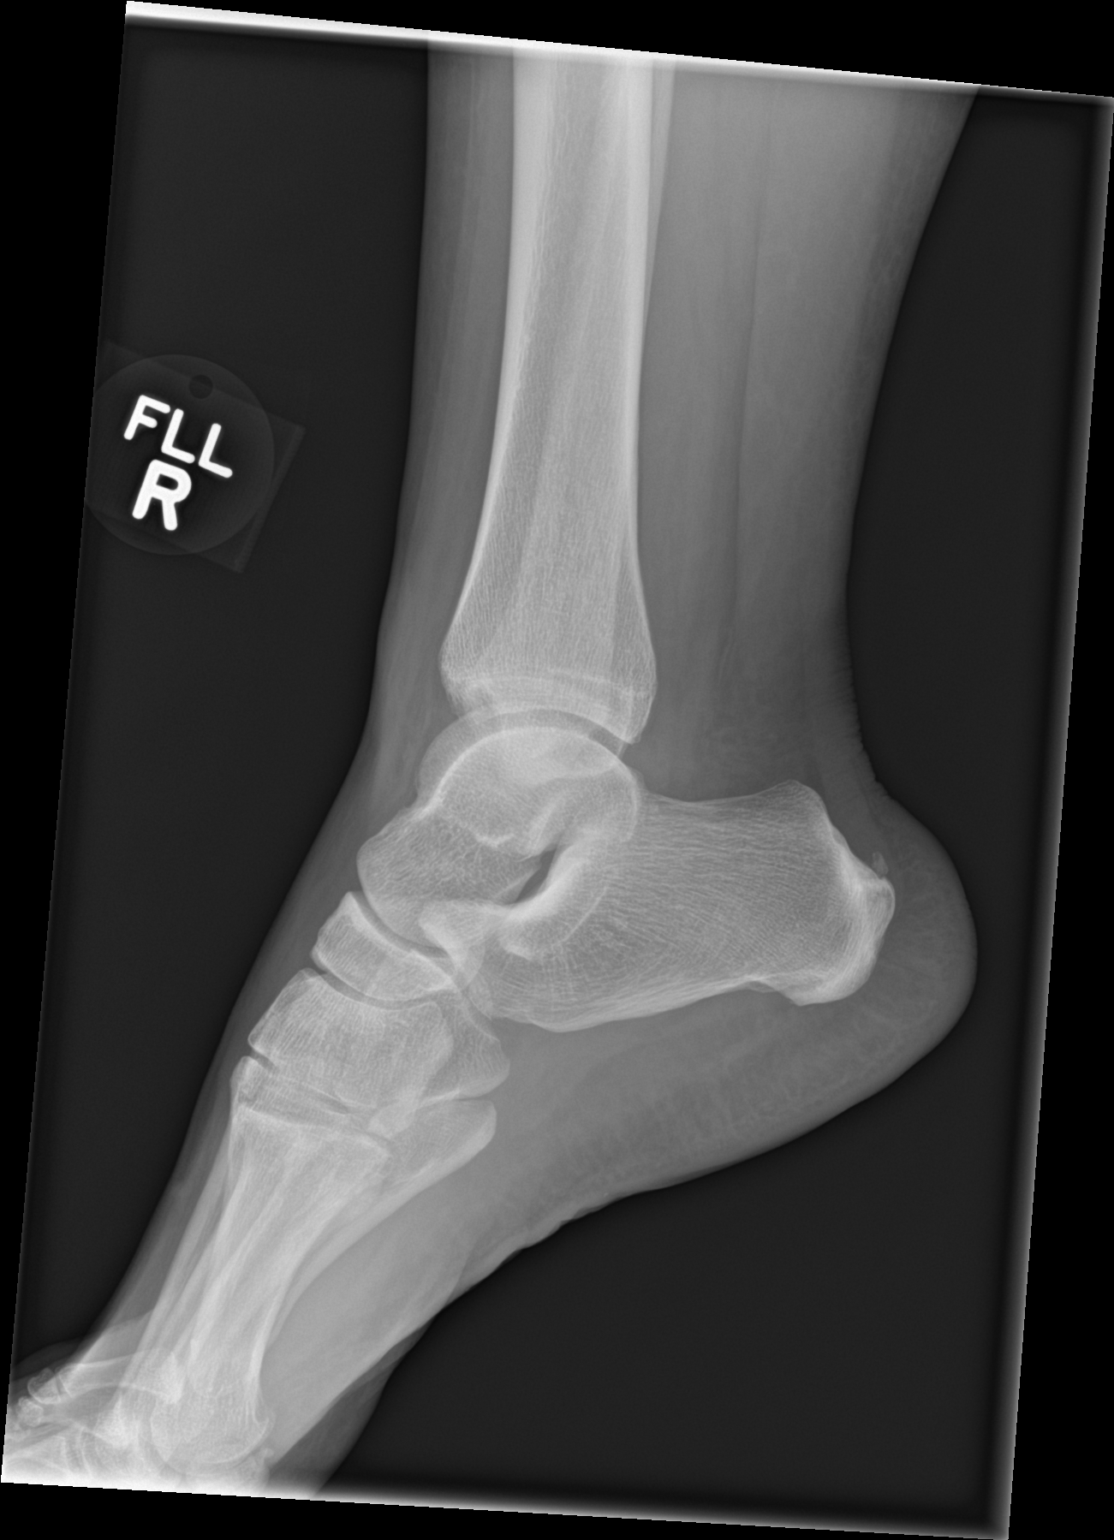

[3 of 3 positions shown; findings below may reference images not displayed]

FINDINGS: There is no definite evidence of fracture, dislocation, or joint
effusion. An ossific density at the medial malleolus is favored to
represent an accessory ossicle. There is no significant overlying
soft tissue swelling. There is no evidence of arthropathy or other
focal bone abnormality. Soft tissues are unremarkable.
IMPRESSION: 1. No definite evidence for acute fracture, dislocation or effusion.
2. Ossific density at the medial malleolus without significant
overlying soft tissue swelling is favored to represent an accessory
ossicle. Correlate with any point tenderness at this region.

## 2019-09-22 IMAGING — DX DG LUMBAR SPINE COMPLETE 4+V
5 series · 5 of 5 positions shown · non-contrast
Comparison: Radiograph 06/17/2014

CLINICAL DATA: Lumbosacral back pain.

EXAM:
LUMBAR SPINE - COMPLETE 4+ VIEW

[l-spine ap]
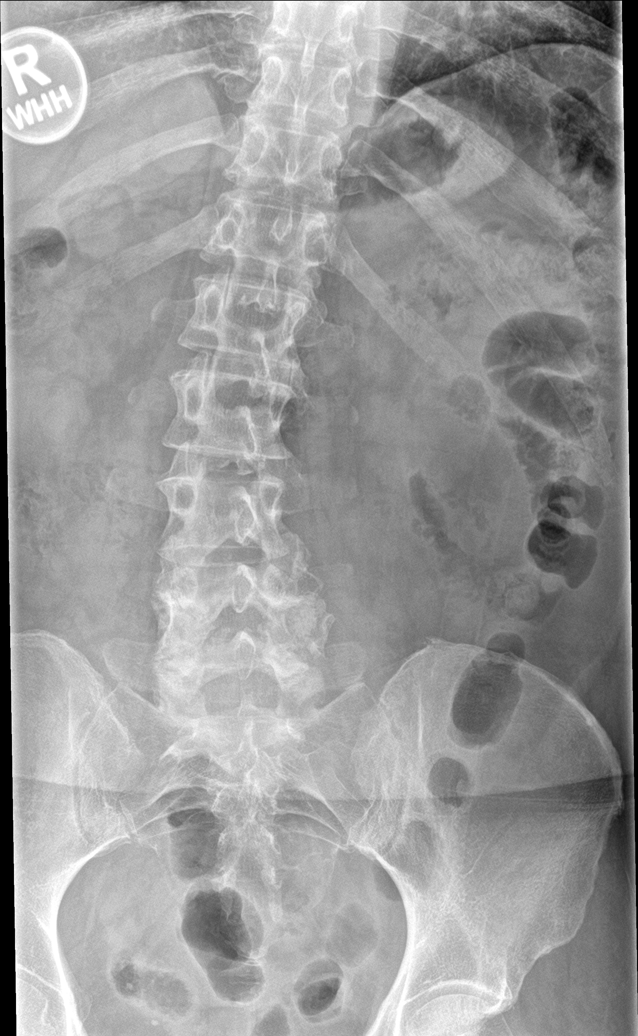

[l-spine obl (1 of 2)]
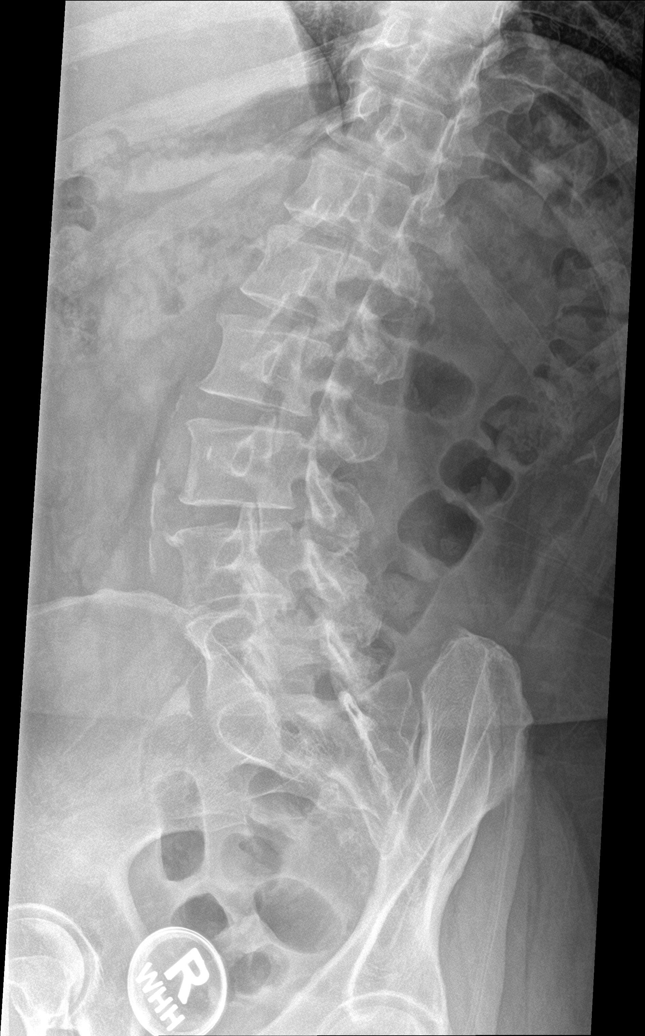

[l-spine obl (2 of 2)]
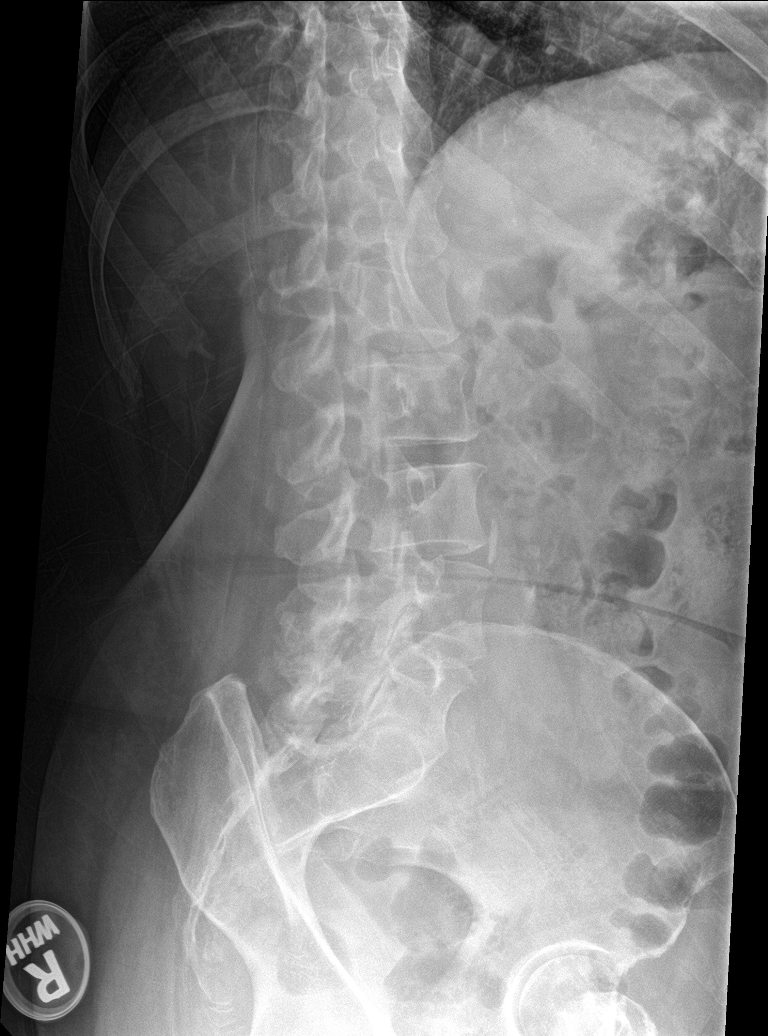

[l-spine lat]
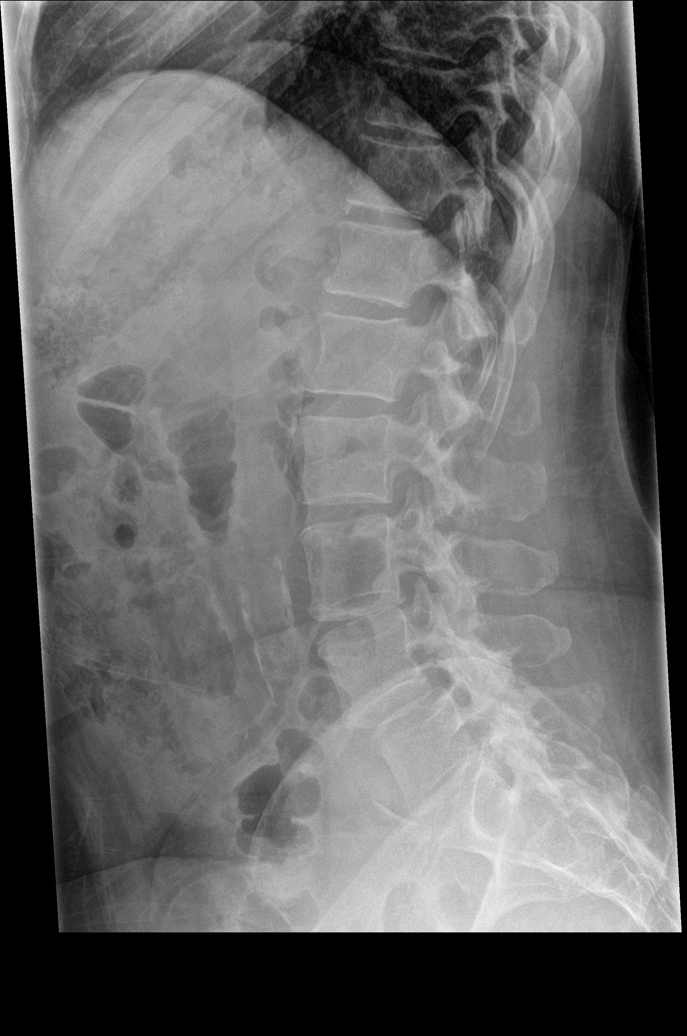

[l-spine spot]
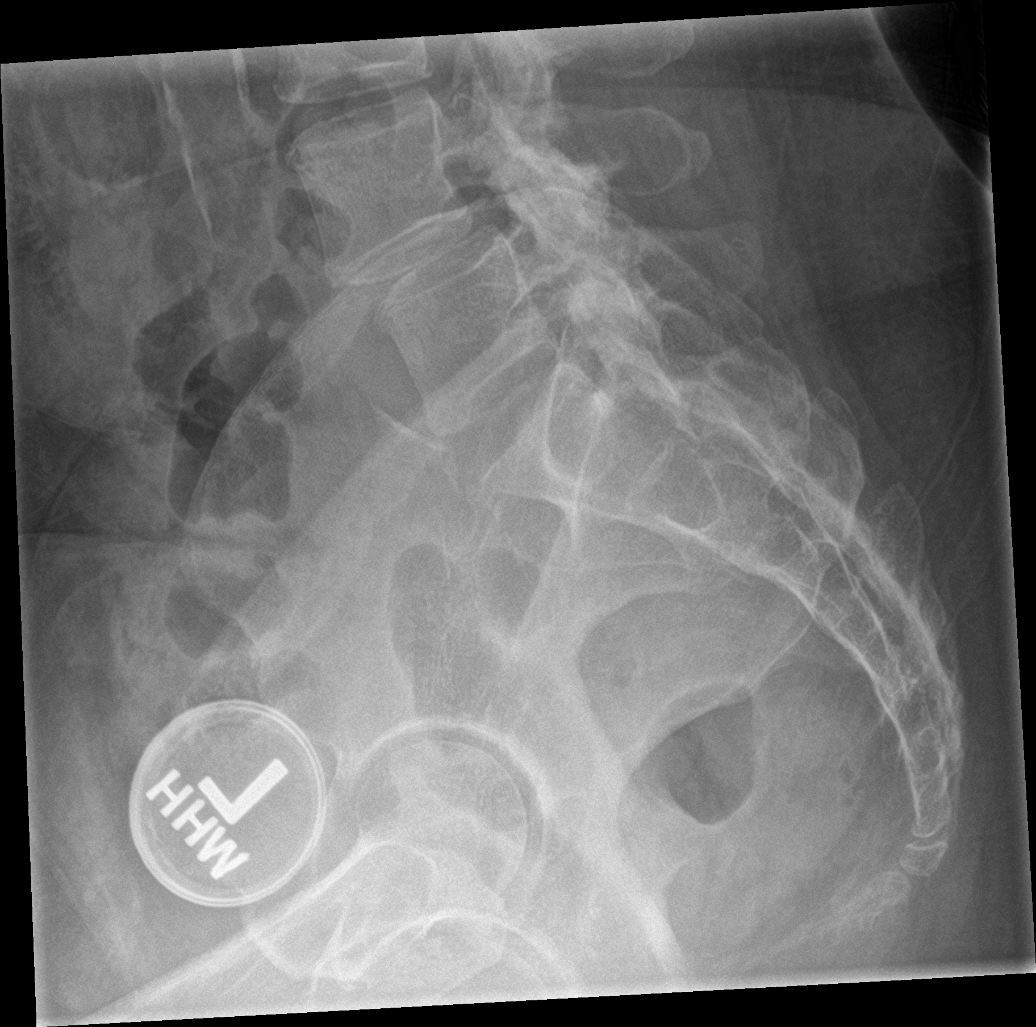

[5 of 5 positions shown; findings below may reference images not displayed]

FINDINGS: Broad-based dextroscoliotic curvature lumbar spine which is
increased from 8914. Unchanged minimal anterolisthesis of L4 on L5.
Facet arthropathy at L4-L5 and L5-S1. Mild multilevel endplate
spurring with preservation of disc spaces. Vertebral body heights
are preserved. No fracture or evidence of focal lesion.
Atherosclerosis of the abdominal aorta is incidentally noted.
IMPRESSION: 1. No evidence of acute abnormality.
[DATE] arthropathy at L4-L5 and L5-S1. Unchanged grade 1
anterolisthesis of L4 on L5, likely secondary to facet disease.
3. Increased rightward curvature of the lumbar spine compared to
[DATE] be positional or progressive scoliosis.

## 2020-05-14 ENCOUNTER — Encounter: Payer: Self-pay | Admitting: Neurology

## 2020-05-28 ENCOUNTER — Other Ambulatory Visit: Payer: Self-pay

## 2020-05-28 DIAGNOSIS — Z20822 Contact with and (suspected) exposure to covid-19: Secondary | ICD-10-CM

## 2020-05-30 LAB — SARS-COV-2, NAA 2 DAY TAT

## 2020-05-30 LAB — NOVEL CORONAVIRUS, NAA: SARS-CoV-2, NAA: NOT DETECTED

## 2020-06-04 ENCOUNTER — Other Ambulatory Visit: Payer: Self-pay | Admitting: Critical Care Medicine

## 2020-06-04 ENCOUNTER — Other Ambulatory Visit: Payer: Self-pay

## 2020-06-04 DIAGNOSIS — Z20822 Contact with and (suspected) exposure to covid-19: Secondary | ICD-10-CM

## 2020-06-06 LAB — NOVEL CORONAVIRUS, NAA: SARS-CoV-2, NAA: NOT DETECTED

## 2020-08-06 NOTE — Progress Notes (Addendum)
NEUROLOGY CONSULTATION NOTE  Riva Sesma MRN: [id number] DOB: November 06, 1955  Referring provider: Juluis Rainier, MD Primary care provider: Jarrett Soho, PA-C  Reason for consult:  Dizziness, weakness, paresthesias  HISTORY OF PRESENT ILLNESS: Angel Griffith is a 64 year old primarily left-handed female with history of chronic headache, cervical disc disease/cervical stenosis status post fusion and decompression, and history of Lyme who presents for dizziness, weakness and paresthesias.  History supplemented by referring provider's note.  She had her first Pfizer Covid vaccine on 3/26.  Within 48 hours, she developed diffuse joint pain and swelling, including in her hands, knees and ankles.  She then noticed weakness in her proximal legs.  If she was down on the floor, she couldn't stand up.  She felt dizzy.  If she were to look up or lean over, she would lose her balance.  When she would lay down, she experienced spinning.  She started getting round dark red hyperpigmentation on her skin, usually on the arms.  She also would experience intermittent paresthesias involving different parts of her body, including her face.  She reports that when she was in her 4s, she had episode of elevated CPK in the 800s.  Also, her mother has history of rheumatoid arthritis, lupus and polymyositis.  Patient is concerned that the vaccine may have triggered an autoimmune disease.  Labs in April included CK 127, aldolase 5, parvovirus B19 IgG 3.8 and IgM 0.3.  She saw a rheumatologist in July.  Labs demonstrated positive ANA 1:320 speckled with elevated anti-Ro of >1685 but otherwise autoimmune panel was negative.  RF negative, anti-CCP negative, antiphospholipid panel negative, thyroid panel negative, sed rate 33 and CRP 5.  She was told she had a low likelihood of having lupus.  In August, she had flu-like symptoms.  Her rapid COVID test was positive but she says she was never treated.  07/31/2020 covid 2 ab  22.7.  She never received her second vaccine.  Incidentally, she was diagnosed with Lyme's disease a year and a half ago (ADDENDUM:  Diagnosed with Lyme's disease 10 years ago) after she had a tick bite. At that time she had a characteristic bull's-eye rash and then developed fatigue, ankle swelling, and elbow pain. Lyme titers were negative based on her clinical symptoms, she was treated with doxycycline. On occasions, she still experiences ankle swelling, fatigue, and elbow pain.  Paresthesias anywhere    PAST MEDICAL HISTORY: Past Medical History:  Diagnosis Date  . Allergic rhinitis   . Asthma   . Cervical disc disease   . Chronic headache   . Family history of factor V deficiency   . Hypertension   . Peptic ulcer disease   . Rotator cuff syndrome     PAST SURGICAL HISTORY: Past Surgical History:  Procedure Laterality Date  . btl    . CESAREAN SECTION    . NECK SURGERY    . TONSILLECTOMY      MEDICATIONS: Current Outpatient Medications on File Prior to Visit  Medication Sig Dispense Refill  . albuterol (PROVENTIL HFA;VENTOLIN HFA) 108 (90 BASE) MCG/ACT inhaler Inhale 2 puffs into the lungs every 6 (six) hours as needed. 1 Inhaler 11  . cyclobenzaprine (FLEXERIL) 10 MG tablet Take 10 mg by mouth 3 (three) times daily as needed for muscle spasms.    . hydrochlorothiazide (HYDRODIURIL) 25 MG tablet Take 12.5 mg by mouth daily.  0  . HYDROcodone-acetaminophen (NORCO/VICODIN) 5-325 MG tablet Take 1 tablet by mouth every 4 (four) hours as  needed. 12 tablet 0  . ondansetron (ZOFRAN) 4 MG tablet Take 1 tablet (4 mg total) by mouth every 6 (six) hours. 12 tablet 0  . traMADol (ULTRAM) 50 MG tablet Take 1 tablet (50 mg total) by mouth every 6 (six) hours as needed. (Patient not taking: Reported on 07/30/2017) 15 tablet 0   No current facility-administered medications on file prior to visit.    ALLERGIES: Allergies  Allergen Reactions  . Aspirin     REACTION: pt states INTOL to  Aspirin w/ ulcer  . Doxycycline     Could not use the bathroom  . Levofloxacin   . Nabumetone     REACTION: pt states INTOL to Relafen w/ red face \\T \ incr HR  . Naproxen     REACTION: pt states INTOL to Naprosyn w/ red face \\T \ incr HR  . Avelox [Moxifloxacin Hcl In Nacl] Rash    FAMILY HISTORY: Family History  Problem Relation Age of Onset  . Stroke Father   . Cancer Mother   . Thyroid disease Mother   . Osteoarthritis Mother   . Lupus Mother    SOCIAL HISTORY: Social History   Socioeconomic History  . Marital status: Married    Spouse name: Field seismologist  . Number of children: 1  . Years of education: Not on file  . Highest education level: Not on file  Occupational History  . Occupation: massage therapist and horse barn owner  Tobacco Use  . Smoking status: Never Smoker  . Smokeless tobacco: Never Used  Substance and Sexual Activity  . Alcohol use: Yes    Comment: social use  . Drug use: No  . Sexual activity: Yes    Partners: Male  Other Topics Concern  . Not on file  Social History Narrative  . Not on file   Social Determinants of Health   Financial Resource Strain:   . Difficulty of Paying Living Expenses: Not on file  Food Insecurity:   . Worried About Programme researcher, broadcasting/film/video in the Last Year: Not on file  . Ran Out of Food in the Last Year: Not on file  Transportation Needs:   . Lack of Transportation (Medical): Not on file  . Lack of Transportation (Non-Medical): Not on file  Physical Activity:   . Days of Exercise per Week: Not on file  . Minutes of Exercise per Session: Not on file  Stress:   . Feeling of Stress : Not on file  Social Connections:   . Frequency of Communication with Friends and Family: Not on file  . Frequency of Social Gatherings with Friends and Family: Not on file  . Attends Religious Services: Not on file  . Active Member of Clubs or Organizations: Not on file  . Attends Banker Meetings: Not on file  .  Marital Status: Not on file  Intimate Partner Violence:   . Fear of Current or Ex-Partner: Not on file  . Emotionally Abused: Not on file  . Physically Abused: Not on file  . Sexually Abused: Not on file    PHYSICAL EXAM: Blood pressure (!) 156/102, pulse 81, height 5' (1.524 m), weight 163 lb 6.4 oz (74.1 kg), SpO2 92 %. General: No acute distress.  Patient appears well-groomed.  Head:  Normocephalic/atraumatic Eyes:  fundi examined but not visualized Neck: supple, no paraspinal tenderness, full range of motion Back: No paraspinal tenderness Heart: regular rate and rhythm Lungs: Clear to auscultation bilaterally. Vascular: No carotid bruits. Neurological Exam: Mental  status: alert and oriented to person, place, and time, recent and remote memory intact, fund of knowledge intact, attention and concentration intact, speech fluent and not dysarthric, language intact. Cranial nerves: CN I: not tested CN II: pupils equal, round and reactive to light, visual fields intact CN III, IV, VI:  full range of motion, no nystagmus, no ptosis CN V: facial sensation intact CN VII: upper and lower face symmetric CN VIII: hearing intact CN IX, X: gag intact, uvula midline CN XI: sternocleidomastoid and trapezius muscles intact CN XII: tongue midline Bulk & Tone: normal, no fasciculations. Motor:  4+/5 bilateral hip flexors, otherwise 5/5 throughout Sensation:  Pinprick and vibration sensation intact. Deep Tendon Reflexes:  2+ throughout, toes downgoing.  Finger to nose testing:  Without dysmetria.  Heel to shin:  Without dysmetria.  Gait:  Normal station and stride.  Able to turn and tandem walk. Romberg negative.  IMPRESSION: Multiple symptomatology including dizziness, polyarthralgia, paresthesias.  Symptoms seem more likely to be a systemic condition than primary neurologic etiology.  Polyarthralgia, swelling and skin changes would not be attributed to a primary neurologic disease.  She has  history of cervical stenosis but does not exhibit any signs of myelopathy.  She does appear weak in the bilateral hip flexors but otherwise strength intact.  Unclear if this is actually residual adverse reaction to the Covid vaccine.    I would first check labs such as B12 and order NCV-EMG of lower extremities.  If unremarkable, would then consider imaging of the brain and cervical spine.  Unfortunately, she does not have insurance at this time.  We have directed her to look into signing up for the [employer].  Once she has coverage, she was advised to contact our office to order testing.     Shon Millet, DO  CC:  Jarrett Soho, PA-C  Juluis Rainier, MD

## 2020-08-07 ENCOUNTER — Other Ambulatory Visit: Payer: Self-pay

## 2020-08-07 ENCOUNTER — Ambulatory Visit: Payer: Self-pay | Admitting: Neurology

## 2020-08-07 ENCOUNTER — Encounter: Payer: Self-pay | Admitting: Neurology

## 2020-08-07 VITALS — BP 156/102 | HR 81 | Ht 60.0 in | Wt 163.4 lb

## 2020-08-07 DIAGNOSIS — M2550 Pain in unspecified joint: Secondary | ICD-10-CM

## 2020-08-07 DIAGNOSIS — R42 Dizziness and giddiness: Secondary | ICD-10-CM

## 2020-08-07 DIAGNOSIS — R202 Paresthesia of skin: Secondary | ICD-10-CM

## 2020-08-07 DIAGNOSIS — R531 Weakness: Secondary | ICD-10-CM

## 2020-08-07 NOTE — Patient Instructions (Signed)
I would look into the [facility].  At that point, contact me and we can order an MRI and/or other tests.

## 2020-08-10 ENCOUNTER — Other Ambulatory Visit: Payer: Self-pay

## 2020-08-10 ENCOUNTER — Telehealth: Payer: Self-pay

## 2020-08-10 NOTE — Telephone Encounter (Signed)
I have made addendum to chart to reflect correct timeline of Lyme

## 2020-08-10 NOTE — Telephone Encounter (Signed)
Pt left message stating that she read over her chart on my chart and noticed that her medication list states she is taking Hydrocodone ACT, Tramadol, and Zofran. And that she she was bitten by a tick and had Lyme diease year and half ago. It was 10 years ago.     Advised pt that the chart does show that the Patient is not taking these medications. Will advised DR. Jaffe of this and have him review it.
